# Patient Record
Sex: Male | Born: 1990
Health system: Southern US, Community
[De-identification: ages and names within clinical notes are randomized; demographics above are authoritative.]

## PROBLEM LIST (undated history)

## (undated) DIAGNOSIS — E7800 Pure hypercholesterolemia, unspecified: Secondary | ICD-10-CM

## (undated) DIAGNOSIS — F419 Anxiety disorder, unspecified: Secondary | ICD-10-CM

## (undated) DIAGNOSIS — F32A Depression, unspecified: Secondary | ICD-10-CM

## (undated) DIAGNOSIS — F329 Major depressive disorder, single episode, unspecified: Secondary | ICD-10-CM

## (undated) DIAGNOSIS — I10 Essential (primary) hypertension: Secondary | ICD-10-CM

## (undated) DIAGNOSIS — R569 Unspecified convulsions: Secondary | ICD-10-CM

## (undated) HISTORY — DX: Unspecified convulsions: R56.9

## (undated) HISTORY — PX: WISDOM TOOTH EXTRACTION: SHX21

## (undated) HISTORY — PX: VASECTOMY: SHX75

## (undated) HISTORY — DX: Major depressive disorder, single episode, unspecified: F32.9

## (undated) HISTORY — DX: Depression, unspecified: F32.A

## (undated) HISTORY — DX: Pure hypercholesterolemia, unspecified: E78.00

## (undated) HISTORY — DX: Anxiety disorder, unspecified: F41.9

---

## 2016-12-09 ENCOUNTER — Emergency Department (HOSPITAL_COMMUNITY)
Admission: EM | Admit: 2016-12-09 | Discharge: 2016-12-09 | Disposition: A | Discharge status: Home / Self Care | Payer: 59 | Attending: Emergency Medicine | Admitting: Emergency Medicine

## 2016-12-09 ENCOUNTER — Encounter (HOSPITAL_COMMUNITY): Payer: Self-pay | Admitting: Emergency Medicine

## 2016-12-09 DIAGNOSIS — R251 Tremor, unspecified: Secondary | ICD-10-CM | POA: Diagnosis not present

## 2016-12-09 DIAGNOSIS — I10 Essential (primary) hypertension: Secondary | ICD-10-CM | POA: Insufficient documentation

## 2016-12-09 DIAGNOSIS — R569 Unspecified convulsions: Secondary | ICD-10-CM | POA: Diagnosis present

## 2016-12-09 HISTORY — DX: Essential (primary) hypertension: I10

## 2016-12-09 LAB — BASIC METABOLIC PANEL
ANION GAP: 8 (ref 5–15)
BUN: 10 mg/dL (ref 6–20)
CHLORIDE: 102 mmol/L (ref 101–111)
CO2: 29 mmol/L (ref 22–32)
Calcium: 10 mg/dL (ref 8.9–10.3)
Creatinine, Ser: 0.8 mg/dL (ref 0.61–1.24)
GFR calc Af Amer: >60 mL/min (ref >60)
Glucose, Bld: 132 mg/dL — ABNORMAL HIGH (ref 65–99)
POTASSIUM: 4.2 mmol/L (ref 3.5–5.1)
SODIUM: 139 mmol/L (ref 135–145)

## 2016-12-09 NOTE — ED Triage Notes (Signed)
Pt arrives ambulatory to ed with c/o of possible seizure. Per pt wife states he had shaking activity last night while he was asleep. Pt is alert and ox4 at this time.

## 2016-12-09 NOTE — ED Provider Notes (Signed)
[facility]-EMERGENCY DEPT Provider Note   CSN: [id number] Arrival date & time: 12/09/16  1143     History   Chief Complaint Chief Complaint  Patient presents with  . possible seizure    HPI Ryan Gay is a 26 y.o. male.  Patient with history of hypertension presents with questionable seizure activity. No history of prior seizures. Patient states that his wife indicated that for approximately 30 seconds in the middle the night, approximate 3-4AM, he had a shaking spell for about 30 seconds. She was able to wake him up for a brief time but then he went back to sleep. No urinary incontinence. He did noted a small painful area on the tip of his tongue this morning that he attributed to a cold sore. States that he "feels hung over" today, but denies any recent alcohol use. No history of benzo use. No other illicit drugs. No headache or recent head injuries. No vision change, extremity weakness, facial droop, slurred speech. No nausea, vomiting. The onset of this condition was acute. The course is resolved. Aggravating factors: none. Alleviating factors: none.        Past Medical History:  Diagnosis Date  . Hypertension     There are no active problems to display for this patient.   No past surgical history on file.     Home Medications    Prior to Admission medications   Not on File    Family History No family history on file.  Social History Social History  Substance Use Topics  . Smoking status: Not on file  . Smokeless tobacco: Not on file  . Alcohol use Not on file     Allergies   Patient has no known allergies.   Review of Systems Review of Systems  Constitutional: Negative for fever.  HENT: Negative for rhinorrhea and sore throat.   Eyes: Negative for redness.  Respiratory: Negative for cough.   Cardiovascular: Negative for chest pain.  Gastrointestinal: Negative for abdominal pain, diarrhea, nausea and vomiting.  Genitourinary: Negative for dysuria.    Musculoskeletal: Negative for myalgias.  Skin: Negative for rash.  Neurological: Positive for seizures (Questionable). Negative for weakness and headaches.     Physical Exam Updated Vital Signs BP (!) 185/115 (BP Location: Right Arm)   Pulse 72   Temp 98.4 F (36.9 C) (Oral)   Resp 16   Ht 6\' 1"  (1.854 m)   Wt 113.4 kg (250 lb)   SpO2 98%   BMI 32.98 kg/m   Physical Exam  Constitutional: He is oriented to person, place, and time. He appears well-developed and well-nourished.  HENT:  Head: Normocephalic and atraumatic.  Right Ear: Tympanic membrane, external ear and ear canal normal.  Left Ear: Tympanic membrane, external ear and ear canal normal.  Nose: Nose normal.  Mouth/Throat: Uvula is midline, oropharynx is clear and moist and mucous membranes are normal.  There is a small lesion on the lateral tip of left tongue. Unclear etiology. No extensive trauma.   Eyes: Pupils are equal, round, and reactive to light. Conjunctivae, EOM and lids are normal. Right eye exhibits no discharge. Left eye exhibits no discharge.  Neck: Normal range of motion. Neck supple.  Cardiovascular: Normal rate, regular rhythm and normal heart sounds.   Pulmonary/Chest: Effort normal and breath sounds normal.  Abdominal: Soft. There is no tenderness.  Musculoskeletal: Normal range of motion.       Cervical back: He exhibits normal range of motion, no tenderness and no bony tenderness.  Neurological: He is alert and oriented to person, place, and time. He has normal strength and normal reflexes. No cranial nerve deficit or sensory deficit. He exhibits normal muscle tone. He displays a negative Romberg sign. Coordination and gait normal. GCS eye subscore is 4. GCS verbal subscore is 5. GCS motor subscore is 6.  Skin: Skin is warm and dry.  Psychiatric: He has a normal mood and affect.  Nursing note and vitals reviewed.    ED Treatments / Results  Labs (all labs ordered are listed, but only abnormal  results are displayed) Labs Reviewed  BASIC METABOLIC PANEL - Abnormal; Notable for the following:       Result Value   Glucose, Bld 132 (*)    All other components within normal limits     Procedures Procedures (including critical care time)  Medications Ordered in ED Medications - No data to display   Initial Impression / Assessment and Plan / ED Course  I have reviewed the triage vital signs and the nursing notes.  Pertinent labs & imaging results that were available during my care of the patient were reviewed by me and considered in my medical decision making (see chart for details).     Patient seen and examined. Work-up initiated.   Vital signs reviewed and are as follows: BP (!) 185/115 (BP Location: Right Arm)   Pulse 72   Temp 98.4 F (36.9 C) (Oral)   Resp 16   Ht 6\' 1"  (1.854 m)   Wt 113.4 kg (250 lb)   SpO2 98%   BMI 32.98 kg/m   Patient updated on results. Ambulatory referral given for neurology. Patient given note for work until cleared.  Final Clinical Impressions(s) / ED Diagnoses   Final diagnoses:  Episode of shaking   Patients with a shaking episode, unclear if this represented a seizure. Questionable lateral tongue biting. No incontinence. 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 Referral made.  New Prescriptions New Prescriptions   No medications on file     Renne CriglerGeiple, Janalynn Eder, Cordelia Poche-C 12/09/16 1508    Cathren LaineSteinl, Kevin, MD 12/10/16 [phone]

## 2016-12-09 NOTE — Discharge Instructions (Signed)
Please read and follow all provided instructions.  Your diagnoses today include:  1. Episode of shaking     Tests performed today include:  Vital signs. See below for your results today.   Electrolytes - slightly high blood sugar  Medications prescribed:   None  Home care instructions:  Follow any educational materials contained in this packet.  Follow-up instructions: Please follow-up with your primary care provider as needed for further evaluation of your symptoms.  Return instructions:   Please return to the Emergency Department if you experience worsening symptoms.   Please return if you have any other emergent concerns.  Additional Information:  Your vital signs today were: BP (!) 185/115 (BP Location: Right Arm)    Pulse 72    Temp 98.4 F (36.9 C) (Oral)    Resp 16    Ht 6\' 1"  (1.854 m)    Wt 113.4 kg (250 lb)    SpO2 98%    BMI 32.98 kg/m  If your blood pressure (BP) was elevated above 135/85 this visit, please have this repeated by your doctor within one month. ---------------

## 2016-12-13 DIAGNOSIS — S81811A Laceration without foreign body, right lower leg, initial encounter: Secondary | ICD-10-CM | POA: Diagnosis not present

## 2016-12-14 ENCOUNTER — Encounter: Payer: Self-pay | Admitting: Neurology

## 2016-12-14 ENCOUNTER — Ambulatory Visit (INDEPENDENT_AMBULATORY_CARE_PROVIDER_SITE_OTHER): Payer: 59 | Admitting: Neurology

## 2016-12-14 VITALS — BP 164/109 | HR 82 | Ht 73.0 in | Wt 268.6 lb

## 2016-12-14 DIAGNOSIS — R569 Unspecified convulsions: Secondary | ICD-10-CM | POA: Diagnosis not present

## 2016-12-14 DIAGNOSIS — I10 Essential (primary) hypertension: Secondary | ICD-10-CM | POA: Diagnosis not present

## 2016-12-14 DIAGNOSIS — E785 Hyperlipidemia, unspecified: Secondary | ICD-10-CM | POA: Diagnosis not present

## 2016-12-14 NOTE — Progress Notes (Signed)
NEUROLOGY CLINIC NEW PATIENT NOTE  NAME: Ryan Gay DOB: 02/10/1991 REFERRING PHYSICIAN: Renne CriglerGeiple, Joshua, PA-C  I saw Ryan Gay as a new consult in the neurovascular clinic today regarding  Chief Complaint  Patient presents with  . Rm 2  . New Patient (Initial Visit)  . Shaking    Pt for follow-up of 12/09/16 ED visit for shaking episode here to r/o possible seizure.  Ryan Gay.  HPI: Ryan MajorMichael Mandeville is a 26 y.o. male with PMH of HTN, HLD, anxiety and depression who presents as a new patient for questionable seizure-like activity at night.   Patient stated that on 12/09/16 early morning, wife witnessed patient having 1 episode of arm shaking with flexion during sleep, lasting 30 seconds. As per patient, wife said after the shaking, patient was aroused with brief eye opening, however back to sleep right away. Patient cannot recall, but only felt groggy in the morning. Wife claimed he had a tongue biting, however patient stated there was no blood in his mouth and he had chronic tongue ulcers. No b/b incontinence. This episode was never happened before and patient had no seizure history. [facility] follow-up, and recommend no driving until seen by neurologist.  Patient stated that he probably has OSA which likely makes him not sleeping well at night. He also had sleepwalking and sleep talking. Sometime he woke up in the morning in the couch, but he cannot recall why wall pain he went to couch from his bed. He also had a one episode of brief shaking jerking during sleep in 2008 but that was correlated to his vivid dreams of being choked.  He has history of hypertension on chlorthalidone, HLD on Lipitor 10 mg daily. BP today 164/109. Denies smoking, alcohol or illicit drugs.  Past Medical History:  Diagnosis Date  . Anxiety   . Depression   . High cholesterol   . Hypertension    Past Surgical History:  Procedure Laterality Date  . WISDOM TOOTH EXTRACTION     Family  History  Problem Relation Age of Onset  . Hypertension Mother   . Hypothyroidism Mother   . Hypertension Father   . Heart disease Father   . Liver cancer Maternal Grandfather    Current Outpatient Prescriptions  Medication Sig Dispense Refill  . atorvastatin (LIPITOR) 10 MG tablet TK 1 T PO D  2  . CARTIA XT 180 MG 24 hr capsule TK 2 CS PO D  4  . chlorthalidone (HYGROTON) 25 MG tablet TK 1 T PO QD  4   No current facility-administered medications for this visit.    No Known Allergies Social History   Social History  . Marital status: Married    Spouse name: N/A  . Number of children: 2  . Years of education: N/A   Occupational History  [place] [employer]    Social History Main Topics  . Smoking status: Former Smoker    Quit date: 10/03/2016  . Smokeless tobacco: Never Used  . Alcohol use Yes     Comment: 4 drinks per wk  . Drug use: No  . Sexual activity: Yes    Partners: Female   Other Topics Concern  . Not on file   Social History Narrative   Lives at home w/ wife and kids   Right-handed   Caffeine: 1 Red Bull daily    Review of Systems Full 14 system review of systems performed and notable only for those listed, all  others are neg:  Constitutional:   Cardiovascular:  Ear/Nose/Throat:   Skin:  Eyes:   Respiratory:   Gastroitestinal:   Genitourinary:  Hematology/Lymphatic:   Endocrine:  Musculoskeletal:   Allergy/Immunology:   Neurological:   Psychiatric: Depression, anxiety, too much sleep, decreased energy, disinterest in activities  Sleep:    Physical Exam  Vitals:   12/14/16 1142  BP: (!) 164/109  Pulse: 82    General - Well nourished, well developed, in no apparent distress.  Ophthalmologic - Sharp disc margins OU.  Cardiovascular - Regular rate and rhythm with no murmur. Carotid pulses were 2+ without bruits .   Neck - supple, no nuchal rigidity .  Mental Status -  Level of arousal and orientation to time, place, and person  were intact. Language including expression, naming, repetition, comprehension, reading, and writing was assessed and found intact. Attention span and concentration were normal. Fund of Knowledge was assessed and was intact.  Cranial Nerves II - XII - II - Visual field intact OU. III, IV, VI - Extraocular movements intact. V - Facial sensation intact bilaterally. VII - Facial movement intact bilaterally. VIII - Hearing & vestibular intact bilaterally. X - Palate elevates symmetrically. XI - Chin turning & shoulder shrug intact bilaterally. XII - Tongue protrusion intact.  Motor Strength - The patient's strength was normal in all extremities and pronator drift was absent.  Bulk was normal and fasciculations were absent.   Motor Tone - Muscle tone was assessed at the neck and appendages and was normal.  Reflexes - The patient's reflexes were normal in all extremities and he had no pathological reflexes.  Sensory - Light touch, temperature/pinprick, vibration and proprioception, and Romberg testing were assessed and were normal.    Coordination - The patient had normal movements in the hands and feet with no ataxia or dysmetria.  Tremor was absent.  Gait and Station - The patient's transfers, posture, gait, station, and turns were observed as normal.   Imaging none  Lab Review none  Assessment and Plan:   In summary, Ryan Gay is a 26 y.o. male with PMH of HTN and HLD presents As new patient for one episode of questionable nocturnal seizure activity. Differentials including sleep apnea induced hypoxia, new onset nocturnal seizure, seizure due to lowered seizure threshold secondary to OSA, REM sleep behavior disorder, or sleep walking. Will do MRI with and without contrast, EEG, and sleep study. No AEDs for now. Consider ambulatory long-term EEG if episodes recurrent and frequent. Seizure precautions, no driving for 6 months until spells free.   - will do MRI with and without  contrast - will do EEG - will do sleep study - no need for AEDs at this time  - if spells recurrent and frequent, will consider home EEG monitoring. - for safety concerns, according to [place] law, no driving until seizure free for 6 months and under physician's care.  - Please maintain seizure precautions.  - follow up in 2 months   Thank you very much for the opportunity to participate in the care of this patient.  Please do not hesitate to call if any questions or concerns arise.  Orders Placed This Encounter  Procedures  . MR BRAIN W WO CONTRAST    Standing Status:   Future    Standing Expiration Date:   02/13/2018    Order Specific Question:   If indicated for the ordered procedure, I authorize the administration of contrast media per Radiology protocol    Answer:  Yes    Order Specific Question:   What is the patient's sedation requirement?    Answer:   No Sedation    Order Specific Question:   Does the patient have a pacemaker or implanted devices?    Answer:   No    Order Specific Question:   Radiology Contrast Protocol - do NOT remove file path    Answer:   [email or web address]    Order Specific Question:   Reason for Exam additional comments    Answer:   seizure like activity    Order Specific Question:   Preferred imaging location?    Answer:   Internal  . Ambulatory referral to Sleep Studies    Referral Priority:   Routine    Referral Type:   Consultation    Referral Reason:   Specialty Services Required    Number of Visits Requested:   1  . EEG adult    Standing Status:   Future    Standing Expiration Date:   12/14/2017    Order Specific Question:   Where should this test be performed?    Answer:   [facility]    Meds ordered this encounter  Medications  . CARTIA XT 180 MG 24 hr capsule    Sig: TK 2 CS PO D    Refill:  4  . atorvastatin (LIPITOR) 10 MG tablet    Sig: TK 1 T PO D    Refill:  2  . chlorthalidone (HYGROTON) 25 MG tablet    Sig: TK 1  T PO QD    Refill:  4    Patient Instructions  - your spells the other day suspicious for seizure like activity, but differentials also include sleep apnea, REM sleep behaviors, sleep walking, etc. We also know sleep apnea could lower the seizure threshold.  - will do seizure work up including MRI with and without contrast - will do EEG - will do sleep study - no need for medications at this time  - if spells recurrent, please let us know and we may need home EEG monitoring. - for safety concerns, according to [place] law, you can not drive until seizure free for 6 months and under physician's care. However, you are able to back to work with other job duties without driving or activities where a loss of awareness could hurt you or someone else. - Please maintain seizure precautions. Do not participate in activities where a loss of awareness could hurt you or someone else.  No swimming alone, no tub bathing, no hot tubs, no driving, no operating motorized vehicles(cars, ATVs, motorbikes, etc), lawnmowers or power tools.  No standing at heights, such as rooftops, ladders or stairs. No sliding boards, monkey bars or swings, or climbing trees.  Avoid hot objects such as stoves, heaters, open fires.  Wear a helmet when riding a bicycle, scooter, skateboard, etc. and avoid areas of traffic.  Set water heater to 120 degrees or less. - follow up in 2 months     [name] Plan, MD PhD [facility] [address], [address] [place], [place] [address] [phone]

## 2016-12-14 NOTE — Patient Instructions (Addendum)
-   your spells the other day suspicious for seizure like activity, but differentials also include sleep apnea, REM sleep behaviors, sleep walking, etc. We also know sleep apnea could lower the seizure threshold.  - will do seizure work up including MRI with and without contrast - will do EEG - will do sleep study - no need for medications at this time  - if spells recurrent, please let us know and we may need home EEG monitoring. - for safety concerns, according to [place] law, you can not drive until seizure free for 6 months and under physician's care. However, you are able to back to work with other job duties without driving or activities where a loss of awareness could hurt you or someone else. - Please maintain seizure precautions. Do not participate in activities where a loss of awareness could hurt you or someone else.  No swimming alone, no tub bathing, no hot tubs, no driving, no operating motorized vehicles(cars, ATVs, motorbikes, etc), lawnmowers or power tools.  No standing at heights, such as rooftops, ladders or stairs. No sliding boards, monkey bars or swings, or climbing trees.  Avoid hot objects such as stoves, heaters, open fires.  Wear a helmet when riding a bicycle, scooter, skateboard, etc. and avoid areas of traffic.  Set water heater to 120 degrees or less. - follow up in 2 months

## 2016-12-15 DIAGNOSIS — E785 Hyperlipidemia, unspecified: Secondary | ICD-10-CM | POA: Insufficient documentation

## 2016-12-15 DIAGNOSIS — I10 Essential (primary) hypertension: Secondary | ICD-10-CM | POA: Insufficient documentation

## 2016-12-16 ENCOUNTER — Other Ambulatory Visit: Payer: 59

## 2016-12-30 ENCOUNTER — Encounter: Payer: Self-pay | Admitting: Neurology

## 2016-12-30 ENCOUNTER — Ambulatory Visit (INDEPENDENT_AMBULATORY_CARE_PROVIDER_SITE_OTHER): Payer: 59 | Admitting: Neurology

## 2016-12-30 VITALS — BP 157/105 | HR 116 | Wt 263.2 lb

## 2016-12-30 DIAGNOSIS — I10 Essential (primary) hypertension: Secondary | ICD-10-CM | POA: Diagnosis not present

## 2016-12-30 DIAGNOSIS — R569 Unspecified convulsions: Secondary | ICD-10-CM

## 2016-12-30 DIAGNOSIS — E785 Hyperlipidemia, unspecified: Secondary | ICD-10-CM | POA: Diagnosis not present

## 2016-12-30 NOTE — Patient Instructions (Signed)
-   encourage to have MRI and EEG done if able - will do sleep study - no need for AEDs at this time  - if spells recurrent and frequent, will consider home EEG monitoring. - refer to Dr. Karel Jarvis to get second opinion - for safety concerns, according to [place] law, no driving until seizure free for 6 months and under physician's care.  - Please maintain seizure precautions.  - follow up will be made after seeing Dr. Karel Jarvis

## 2016-12-30 NOTE — Progress Notes (Signed)
NEUROLOGY CLINIC NEW PATIENT NOTE  NAME: Ryan Gay DOB: [birth year] REFERRING PHYSICIAN: Boneta Lucks, NP  I saw Ryan Gay as a new consult in the neurovascular clinic today regarding  Chief Complaint  Patient presents with  . Follow-up    Pt wants to discuss driving he has been put on driving restictions,Pt never got EEG wanted a second p but never got it  .  HPI: Ryan Gay is a 26 y.o. male with PMH of HTN, HLD, anxiety and depression who presents as a new patient for questionable seizure-like activity at night.   Patient stated that on 12/09/16 early morning, wife witnessed patient having 1 episode of arm shaking with flexion during sleep, lasting 30 seconds. As per patient, wife said after the shaking, patient was aroused with brief eye opening, however back to sleep right away. Patient cannot recall, but only felt groggy in the morning. Wife claimed he had a tongue biting, however patient stated there was no blood in his mouth and he had chronic tongue ulcers. No b/b incontinence. This episode was never happened before and patient had no seizure history. Went to [facility] and recommend outpatient follow-up, and recommend no driving until seen by neurologist.  Patient stated that he probably has OSA which likely makes him not sleeping well at night. He also had sleepwalking and sleep talking. Sometime he woke up in the morning in the couch, but he cannot recall why wall pain he went to couch from his bed. He also had a one episode of brief shaking jerking during sleep in 2008 but that was correlated to his vivid dreams of being choked.  He has history of hypertension on chlorthalidone, HLD on Lipitor 10 mg daily. BP today 164/109. Denies smoking, alcohol or illicit drugs.  Interval history: During the interval time, pt has been doing ok. No recurrent symptoms. 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 I totally understand.  denies SI or HI. 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 However, I am not feel confident to do that. I told him about the process dealing with unexplained episode of potential LOC which prohibited him driving at this time. He has not had any test done yet, and scheduled for sleep study next week. He has some financial difficulty with out of pocket payment for MRI and EEG. He and his wife also asking for second opinion and I feel it is very reasonable.    Past Medical History:  Diagnosis Date  . Anxiety   . Depression   . High cholesterol   . Hypertension    Past Surgical History:  Procedure Laterality Date  . WISDOM TOOTH EXTRACTION     Family History  Problem Relation Age of Onset  . Hypertension Mother   . Hypothyroidism Mother   . Hypertension Father   . Heart disease Father   . Liver cancer Maternal Grandfather    Current Outpatient Prescriptions  Medication Sig Dispense Refill  . atorvastatin (LIPITOR) 10 MG tablet TK 1 T PO D  2  . CARTIA XT 180 MG 24 hr capsule TK 2 CS PO D  4  . chlorthalidone (HYGROTON) 25 MG tablet TK 1 T PO QD  4   No current facility-administered medications for this visit.    No Known Allergies Social History  Social History  . Marital status: Married    Spouse name: N/A  . Number of children: 2  . Years of education: N/A   Occupational History  . [employer]    Social History Main Topics  . Smoking status: Former Smoker    Quit date: 10/03/2016  . Smokeless tobacco: Never Used  . Alcohol use Yes     Comment: 4 drinks per wk  . Drug use: No  . Sexual activity: Yes    Partners: Female   Other Topics Concern  . Not on file   Social History Narrative   Lives at home w/ wife and kids   Right-handed   Caffeine: 1 Red Bull daily    Review of Systems Full 14 system review of systems performed and notable only for those listed, all  others are neg:  Constitutional:  fatigue Cardiovascular:  Ear/Nose/Throat:   Skin:  Eyes:   Respiratory:   Gastroitestinal:   Genitourinary:  Hematology/Lymphatic:   Endocrine:  Musculoskeletal:  Aching muscle Allergy/Immunology:   Neurological:  HA Psychiatric: Depression Sleep: insomnia, apnea, frequent waking, daytime sleepiness, sleep talking, sleep walking, acting out dreams   Physical Exam  Vitals:   12/30/16 1340  BP: (!) 157/105  Pulse: (!) 116    General - Well nourished, well developed, in no apparent distress.  Ophthalmologic - Sharp disc margins OU.  Cardiovascular - Regular rate and rhythm with no murmur. Carotid pulses were 2+ without bruits .   Neck - supple, no nuchal rigidity .  Mental Status -  Level of arousal and orientation to time, place, and person were intact. Language including expression, naming, repetition, comprehension, reading, and writing was assessed and found intact. Attention span and concentration were normal. Fund of Knowledge was assessed and was intact.  Cranial Nerves II - XII - II - Visual field intact OU. III, IV, VI - Extraocular movements intact. V - Facial sensation intact bilaterally. VII - Facial movement intact bilaterally. VIII - Hearing & vestibular intact bilaterally. X - Palate elevates symmetrically. XI - Chin turning & shoulder shrug intact bilaterally. XII - Tongue protrusion intact.  Motor Strength - The patient's strength was normal in all extremities and pronator drift was absent.  Bulk was normal and fasciculations were absent.   Motor Tone - Muscle tone was assessed at the neck and appendages and was normal.  Reflexes - The patient's reflexes were normal in all extremities and he had no pathological reflexes.  Sensory - Light touch, temperature/pinprick, vibration and proprioception, and Romberg testing were assessed and were normal.    Coordination - The patient had normal movements in the hands and  feet with no ataxia or dysmetria.  Tremor was absent.  Gait and Station - The patient's transfers, posture, gait, station, and turns were observed as normal.   Imaging pending  Lab Review none  Assessment:   In summary, Ryan Gay is a 26 y.o. male with PMH of HTN and HLD presents As new patient for one episode of questionable nocturnal seizure activity. Differentials including sleep apnea induced hypoxia, new onset nocturnal seizure, seizure due to lowered seizure threshold secondary to OSA, REM sleep behavior disorder, or sleep walking. Will do MRI with and without contrast, EEG, and sleep study. No AEDs for now. Consider ambulatory long-term EEG if episodes recurrent and frequent. Seizure precautions, no driving for 6 months until spells free. Again discuss with pt about reasoning for the tests and possible ddx and not feel he is  safe to drive. But will refer him to see Dr. Karel Jarvis for second opinion.    Plan: - encourage to have MRI and EEG done if able - will do sleep study - no need for AEDs at this time  - if spells recurrent and frequent, will consider home EEG monitoring. - refer to Dr. Karel Jarvis to get second opinion - for safety concerns, according to [place] law, no driving until seizure free for 6 months and under physician's care.  - Please maintain seizure precautions.  - follow up will be made after seeing Dr. Karel Jarvis  I spent more than 25 minutes of face to face time with the patient. Greater than 50% of time was spent in counseling and coordination of care. We discussed ddx of his spells, routine process for work up, no driving for 6 months post spell, and second opinion from Dr. Karel Jarvis.    Orders Placed This Encounter  Procedures  . Ambulatory referral to Neurology    Referral Priority:   Routine    Referral Type:   Consultation    Referral Reason:   Second Opinion    Referred to Provider:   Van Clines, MD    Requested Specialty:   Neurology    Number of Visits  Requested:   1    No orders of the defined types were placed in this encounter.   Patient Instructions  - encourage to have MRI and EEG done if able - will do sleep study - no need for AEDs at this time  - if spells recurrent and frequent, will consider home EEG monitoring. - refer to Dr. Karel Jarvis to get second opinion - for safety concerns, according to [place] law, no driving until seizure free for 6 months and under physician's care.  - Please maintain seizure precautions.  - follow up will be made after seeing Dr. Artelia Laroche, MD PhD [facility] [address], [address] [place], [place] [address] [phone]

## 2017-01-04 ENCOUNTER — Telehealth: Payer: Self-pay

## 2017-01-04 NOTE — Telephone Encounter (Signed)
Which form? Disability form? I think either PCP or [place] can fill it out.   [name] Plan, MD PhD Stroke Neurology 01/04/2017 1:32 PM

## 2017-01-04 NOTE — Telephone Encounter (Signed)
Message sent to Dr. Roda Shutters if he wants pt PCP to filled form out. Pt will be seeing Dr. Karel Jarvis at [facility] for a second opinion for his episodes.

## 2017-01-06 NOTE — Telephone Encounter (Signed)
Form will be filled out until pt sees Dr. Lahoma RockerAqunio at [facility] for second opinion.

## 2017-01-13 NOTE — Telephone Encounter (Signed)
I called pt back and explained in my error I misunderstood RN's note, pt has been advised that Dr Roda ShuttersXu was not in office last week but that it will be done this week (according to RN Katrina).  Pt also made aware of the $50.00 fee for the completion of the form, he okayed and will wait to be contacted re: the completion of the form

## 2017-01-13 NOTE — Telephone Encounter (Signed)
Pt has called for an update on the status of the paperwork being available to pick up.  Pt advised of last response from RN Katrina on 8-29, he is asking for a call back as to why it can not be done before, please call.

## 2017-01-13 NOTE — Telephone Encounter (Signed)
Disability form done for patient by Dr. Roda ShuttersXu. Form sent to medical records for processing and payment of 50.00 dollars.

## 2017-01-14 ENCOUNTER — Telehealth: Payer: Self-pay | Admitting: Neurology

## 2017-01-14 ENCOUNTER — Institutional Professional Consult (permissible substitution): Payer: 59 | Admitting: Neurology

## 2017-01-14 NOTE — Telephone Encounter (Signed)
Pt was a no show to their apt today scheduled at 1:00 pm

## 2017-01-15 ENCOUNTER — Encounter: Payer: Self-pay | Admitting: Neurology

## 2017-01-18 DIAGNOSIS — Z0289 Encounter for other administrative examinations: Secondary | ICD-10-CM

## 2017-02-22 ENCOUNTER — Ambulatory Visit: Payer: 59 | Admitting: Neurology

## 2017-03-09 ENCOUNTER — Ambulatory Visit: Payer: 59 | Admitting: Neurology

## 2017-03-09 DIAGNOSIS — I10 Essential (primary) hypertension: Secondary | ICD-10-CM | POA: Diagnosis not present

## 2017-04-30 ENCOUNTER — Telehealth: Payer: Self-pay | Admitting: Neurology

## 2017-04-30 NOTE — Telephone Encounter (Signed)
Rn spoke with patient wife about needing a letter for return to work in February. Rn stated pt requested a second opinion,and was referred to Dr. Karel JarvisAquino at [facility]. Rn stated pt cancel the appt. The wife stated they cancel the appt because of financial reasons. Rn stated pt will need to schedule an appt before he is release for work. PT schedule 06/02/2016 due to Dr. Roda ShuttersXu schedule being limited for February.

## 2017-04-30 NOTE — Telephone Encounter (Signed)
Pt's wife called he is wanting to return to work in February. Pt has not had any episodes. At available appt is 3/5. She did not want to schedule the appt, she said he just needs a release to return to work. Please call the pt at [phone]

## 2017-06-02 ENCOUNTER — Ambulatory Visit: Payer: Self-pay | Admitting: Neurology

## 2017-06-02 ENCOUNTER — Encounter: Payer: Self-pay | Admitting: Neurology

## 2017-06-02 ENCOUNTER — Telehealth: Payer: Self-pay | Admitting: Neurology

## 2017-06-02 VITALS — BP 146/95 | HR 84 | Wt 249.8 lb

## 2017-06-02 DIAGNOSIS — R569 Unspecified convulsions: Secondary | ICD-10-CM | POA: Diagnosis not present

## 2017-06-02 DIAGNOSIS — I10 Essential (primary) hypertension: Secondary | ICD-10-CM

## 2017-06-02 DIAGNOSIS — E785 Hyperlipidemia, unspecified: Secondary | ICD-10-CM

## 2017-06-02 NOTE — Progress Notes (Signed)
NEUROLOGY CLINIC NEW PATIENT NOTE  NAME: Ryan MajorMichael Gay DOB: 04/02/1991 REFERRING PHYSICIAN: No ref. provider found  I saw Ryan Gay as a new consult in the neurovascular clinic today regarding  Chief Complaint  Patient presents with  . 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  HPI: Ryan Gay is a 27 y.o. male with PMH of HTN, HLD, anxiety and depression who presents as a new patient for questionable seizure-like activity at night.   Patient stated that on 12/09/16 early morning, wife witnessed patient having 1 episode of arm shaking with flexion during sleep, lasting 30 seconds. As per patient, wife said after the shaking, patient was aroused with brief eye opening, however back to sleep right away. Patient cannot recall, but only felt groggy in the morning. Wife claimed he had a tongue biting, however patient stated there was no blood in his mouth and he had chronic tongue ulcers. No b/b incontinence. This episode was never happened before and patient had no seizure history. [facility] and recommend outpatient follow-up, and recommend no driving until seen by neurologist.  Patient stated that he probably has OSA which likely makes him not sleeping well at night. He also had sleepwalking and sleep talking. Sometime he woke up in the morning in the couch, but he cannot recall why wall pain he went to couch from his bed. He also had a one episode of brief shaking jerking during sleep in 2008 but that was correlated to his vivid dreams of being choked.  He has history of hypertension on chlorthalidone, HLD on Lipitor 10 mg daily. BP today 164/109. Denies smoking, alcohol or illicit drugs.  12/30/16 follow up - pt has been doing ok. No recurrent symptoms. 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 I totally understand. denies SI or HI. 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 However, I am not feel confident to do that. I told him about the process dealing with unexplained episode of potential LOC which prohibited him driving at this time. He has not had any test done yet, and scheduled for sleep study next week. He has some financial difficulty with out of pocket payment for MRI and EEG. He and his wife also asking for second opinion and I feel it is very reasonable.   Interval history: During the interval time, pt has been doing well. No recurrent seizure like activity. 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 However, he was not able to get MRI, EEG, sleep study or seeing Dr. Karel JarvisAquino due to insurance coverage and high deductible. He is near 6 months no driving and came in for re-evaluation.    Past Medical History:  Diagnosis Date  . Anxiety   . Depression   . High cholesterol   . Hypertension   . Seizures (HCC)    Past Surgical History:  Procedure Laterality Date  . WISDOM TOOTH EXTRACTION     Family History  Problem Relation Age of Onset  . Hypertension Mother   . Hypothyroidism Mother   . Hypertension Father   . Heart disease Father   .  Liver cancer Maternal Grandfather    Current Outpatient Medications  Medication Sig Dispense Refill  . atorvastatin (LIPITOR) 10 MG tablet TK 1 T PO D  2  . CARTIA XT 180 MG 24 hr capsule TK 2 CS PO D  4  . chlorthalidone (HYGROTON) 25 MG tablet TK 1 T PO QD  4   No current facility-administered medications for this visit.    No Known Allergies Social History   Socioeconomic History  . Marital status: Married    Spouse name: Not on file  . Number of children: 2  . Years of education: Not on file  . Highest education level: Not on file  Social Needs  . Financial resource strain: Not on file  . Food insecurity - worry: Not on file  . Food insecurity -  inability: Not on file  . Transportation needs - medical: Not on file  . Transportation needs - non-medical: Not on file  Occupational History  . Occupation: [employer]  Tobacco Use  . Smoking status: Former Smoker    Last attempt to quit: 10/03/2016    Years since quitting: 0.6  . Smokeless tobacco: Never Used  Substance and Sexual Activity  . Alcohol use: Yes    Comment: 4 drinks per wk  . Drug use: No  . Sexual activity: Yes    Partners: Female  Other Topics Concern  . Not on file  Social History Narrative   Lives at home w/ wife and kids   Right-handed   Caffeine: 1 Red Bull daily    Review of Systems Full 14 system review of systems performed and notable only for those listed, all others are neg:  Constitutional:  fatigue Cardiovascular:  Ear/Nose/Throat:   Skin:  Eyes:   Respiratory:   Gastroitestinal:   Genitourinary:  Hematology/Lymphatic:   Endocrine:  Musculoskeletal:   Allergy/Immunology:  Env allergy Neurological:   Psychiatric:  Sleep:    Physical Exam  Vitals:   06/02/17 1225  BP: (!) 146/95  Pulse: 84    General - Well nourished, well developed, in no apparent distress.  Ophthalmologic - Sharp disc margins OU.  Cardiovascular - Regular rate and rhythm with no murmur. Carotid pulses were 2+ without bruits .   Neck - supple, no nuchal rigidity .  Mental Status -  Level of arousal and orientation to time, place, and person were intact. Language including expression, naming, repetition, comprehension, reading, and writing was assessed and found intact. Attention span and concentration were normal. Fund of Knowledge was assessed and was intact.  Cranial Nerves II - XII - II - Visual field intact OU. III, IV, VI - Extraocular movements intact. V - Facial sensation intact bilaterally. VII - Facial movement intact bilaterally. VIII - Hearing & vestibular intact bilaterally. X - Palate elevates symmetrically. XI - Chin turning &  shoulder shrug intact bilaterally. XII - Tongue protrusion intact.  Motor Strength - The patient's strength was normal in all extremities and pronator drift was absent.  Bulk was normal and fasciculations were absent.   Motor Tone - Muscle tone was assessed at the neck and appendages and was normal.  Reflexes - The patient's reflexes were normal in all extremities and he had no pathological reflexes.  Sensory - Light touch, temperature/pinprick, vibration and proprioception, and Romberg testing were assessed and were normal.    Coordination - The patient had normal movements in the hands and feet with no ataxia or dysmetria.  Tremor was absent.  Gait  and Station - The patient's transfers, posture, gait, station, and turns were observed as normal.   Imaging pending  Lab Review none  Assessment:   In summary, Trindon Dorton is a 27 y.o. male with PMH of HTN and HLD presents As new patient for one episode of questionable nocturnal seizure activity. Differentials including sleep apnea induced hypoxia, new onset nocturnal seizure, seizure due to lowered seizure threshold secondary to OSA, REM sleep behavior disorder, or sleep walking. Will do MRI with and without contrast, EEG, and sleep study. No AEDs for now. Consider ambulatory long-term EEG if episodes recurrent and frequent. Seizure precautions, no driving for 6 months until spells free. Since then, he had no recurrent spells. However, he was not able to get MRI, EEG, sleep study or seeing Dr. Karel Jarvis for second opinion due to insurance coverage and high deductible. Has not been driving, but now close to 6 months.    Plan: - check with your insurance and we still encourage to have MRI and EEG as well as sleepy study done if able - in a week, you are OK to back to drive with caution. If you do not feel good that day, do not drive.  - no need for AEDs at this time  - if spells recurrent and frequent, will consider home EEG monitoring.  -  Please maintain seizure precautions.  - follow up in 6 months.   I spent more than 25 minutes of face to face time with the patient. Greater than 50% of time was spent in counseling and coordination of care.     No orders of the defined types were placed in this encounter.   No orders of the defined types were placed in this encounter.   Patient Instructions  - check with your insurance and we still encourage to have MRI and EEG as well as sleepy study done if able - in a week, you are OK to back to drive with caution. If you do not feel good that day, do not drive.  - no need for AEDs at this time  - if spells recurrent and frequent, will consider home EEG monitoring.  - Please maintain seizure precautions.  - follow up in 6 months.    [name] Plan, MD PhD [facility] [address] [place], [place] [address] [phone]

## 2017-06-02 NOTE — Telephone Encounter (Signed)
Sounds good. Thanks.   [name] [name], MD PhD Stroke Neurology 06/02/2017 5:33 PM

## 2017-06-02 NOTE — Telephone Encounter (Signed)
Error

## 2017-06-02 NOTE — Telephone Encounter (Deleted)
Open in error

## 2017-06-02 NOTE — Telephone Encounter (Signed)
Patient states he will callback to schedule 6 month follow-up.

## 2017-06-02 NOTE — Patient Instructions (Signed)
-   check with your insurance and we still encourage to have MRI and EEG as well as sleepy study done if able - in a week, you are OK to back to drive with caution. If you do not feel good that day, do not drive.  - no need for AEDs at this time  - if spells recurrent and frequent, will consider home EEG monitoring.  - Please maintain seizure precautions.  - follow up in 6 months.

## 2017-07-13 ENCOUNTER — Ambulatory Visit: Payer: Self-pay | Admitting: Neurology

## 2017-09-16 MED FILL — CHLORTHALIDONE 25 MG TAB: 25 | 90 days supply | Qty: 90 | Fill #0

## 2017-09-16 MED FILL — ATORVASTATIN 10 MG TABLET: 10 | 90 days supply | Qty: 90 | Fill #0

## 2017-09-16 MED FILL — DILTIAZEM 24HR ER 180 MG CA: 180 | 90 days supply | Qty: 180 | Fill #0

## 2017-12-27 MED FILL — ATORVASTATIN CALCIUM 20 MG: 20 | 90 days supply | Qty: 90 | Fill #0

## 2018-02-23 MED FILL — CARTIA XT 180 MG CAPSULE SA: 180 | 90 days supply | Qty: 180 | Fill #1

## 2018-04-26 MED FILL — CHLORTHALIDONE 25 MG TABS: 25 | 90 days supply | Qty: 90 | Fill #0

## 2018-10-19 MED FILL — CARTIA XT 180 MG CAPSULE SA: 180 | 90 days supply | Qty: 360 | Fill #0

## 2018-10-19 MED FILL — ATORVASTATIN 20 MG TABLET: 20 | 90 days supply | Qty: 90 | Fill #0

## 2018-10-19 MED FILL — CHLORTHALIDONE 25 MG TABLET: 25 | 90 days supply | Qty: 90 | Fill #0

## 2019-02-06 MED FILL — DIAZEPAM 10 MG TABS: 10 | 1 days supply | Qty: 1 | Fill #0

## 2019-02-11 MED FILL — ATORVASTATIN 20 MG TABLET: 20 | 90 days supply | Qty: 90 | Fill #1

## 2019-02-13 ENCOUNTER — Other Ambulatory Visit: Payer: Self-pay

## 2019-02-13 DIAGNOSIS — Z20822 Contact with and (suspected) exposure to covid-19: Secondary | ICD-10-CM

## 2019-02-15 LAB — NOVEL CORONAVIRUS, NAA: SARS-CoV-2, NAA: NOT DETECTED

## 2019-03-01 MED FILL — ATORVASTATIN 20 MG TABLET: 20 | 90 days supply | Qty: 90 | Fill #1

## 2019-03-01 MED FILL — DIAZEPAM 10 MG TABS: 10 | 1 days supply | Qty: 1 | Fill #0

## 2019-05-26 MED FILL — CHLORTHALIDONE 25 MG TABS: 25 | 90 days supply | Qty: 90 | Fill #1

## 2019-05-26 MED FILL — DILTIAZEM HCL ER COATED BEA: 180 | 90 days supply | Qty: 360 | Fill #1

## 2019-07-04 ENCOUNTER — Encounter (HOSPITAL_COMMUNITY): Payer: Self-pay | Admitting: Emergency Medicine

## 2019-07-04 ENCOUNTER — Other Ambulatory Visit: Payer: Self-pay

## 2019-07-04 ENCOUNTER — Inpatient Hospital Stay (HOSPITAL_COMMUNITY)
Admission: EM | Admit: 2019-07-04 | Discharge: 2019-07-06 | DRG: 305 | Disposition: A | Discharge status: Home / Self Care | Payer: No Typology Code available for payment source | Attending: Internal Medicine | Admitting: Internal Medicine

## 2019-07-04 ENCOUNTER — Emergency Department (HOSPITAL_COMMUNITY): Payer: No Typology Code available for payment source

## 2019-07-04 DIAGNOSIS — R739 Hyperglycemia, unspecified: Secondary | ICD-10-CM | POA: Diagnosis present

## 2019-07-04 DIAGNOSIS — R079 Chest pain, unspecified: Secondary | ICD-10-CM

## 2019-07-04 DIAGNOSIS — I160 Hypertensive urgency: Secondary | ICD-10-CM | POA: Diagnosis not present

## 2019-07-04 DIAGNOSIS — M109 Gout, unspecified: Secondary | ICD-10-CM

## 2019-07-04 DIAGNOSIS — I161 Hypertensive emergency: Secondary | ICD-10-CM | POA: Diagnosis not present

## 2019-07-04 DIAGNOSIS — M1A0720 Idiopathic chronic gout, left ankle and foot, without tophus (tophi): Secondary | ICD-10-CM | POA: Diagnosis present

## 2019-07-04 DIAGNOSIS — I10 Essential (primary) hypertension: Secondary | ICD-10-CM | POA: Diagnosis present

## 2019-07-04 DIAGNOSIS — K219 Gastro-esophageal reflux disease without esophagitis: Secondary | ICD-10-CM | POA: Diagnosis present

## 2019-07-04 DIAGNOSIS — Z20822 Contact with and (suspected) exposure to covid-19: Secondary | ICD-10-CM | POA: Diagnosis present

## 2019-07-04 DIAGNOSIS — E785 Hyperlipidemia, unspecified: Secondary | ICD-10-CM | POA: Diagnosis present

## 2019-07-04 DIAGNOSIS — Z79899 Other long term (current) drug therapy: Secondary | ICD-10-CM

## 2019-07-04 DIAGNOSIS — E876 Hypokalemia: Secondary | ICD-10-CM | POA: Diagnosis present

## 2019-07-04 DIAGNOSIS — E7800 Pure hypercholesterolemia, unspecified: Secondary | ICD-10-CM | POA: Diagnosis present

## 2019-07-04 DIAGNOSIS — Z87891 Personal history of nicotine dependence: Secondary | ICD-10-CM

## 2019-07-04 LAB — CBC
HCT: 45.6 % (ref 39.0–52.0)
Hemoglobin: 16.2 g/dL (ref 13.0–17.0)
MCH: 30.7 pg (ref 26.0–34.0)
MCHC: 35.5 g/dL (ref 30.0–36.0)
MCV: 86.5 fL (ref 80.0–100.0)
Platelets: 278 10*3/uL (ref 150–400)
RBC: 5.27 MIL/uL (ref 4.22–5.81)
RDW: 12.6 % (ref 11.5–15.5)
WBC: 9.1 10*3/uL (ref 4.0–10.5)
nRBC: 0 % (ref 0.0–0.2)

## 2019-07-04 LAB — TROPONIN I (HIGH SENSITIVITY)
Troponin I (High Sensitivity): 4 ng/L (ref <18)
Troponin I (High Sensitivity): 7 ng/L (ref <18)

## 2019-07-04 LAB — BASIC METABOLIC PANEL
Anion gap: 15 (ref 5–15)
BUN: 11 mg/dL (ref 6–20)
CO2: 22 mmol/L (ref 22–32)
Calcium: 9.2 mg/dL (ref 8.9–10.3)
Chloride: 99 mmol/L (ref 98–111)
Creatinine, Ser: 0.89 mg/dL (ref 0.61–1.24)
GFR calc Af Amer: >60 mL/min (ref >60)
GFR calc non Af Amer: >60 mL/min (ref >60)
Glucose, Bld: 177 mg/dL — ABNORMAL HIGH (ref 70–99)
Potassium: 3.4 mmol/L — ABNORMAL LOW (ref 3.5–5.1)
Sodium: 136 mmol/L (ref 135–145)

## 2019-07-04 MED ORDER — LABETALOL HCL 5 MG/ML IV SOLN: 20.0000 mg | Freq: Once | INTRAVENOUS | Status: DC

## 2019-07-04 MED ORDER — LORAZEPAM 2 MG/ML IJ SOLN
1.0000 mg | Freq: Once | INTRAMUSCULAR | Status: AC
  Administered 2019-07-04: 23:00:00 1 mg via INTRAVENOUS
  Filled 2019-07-04: qty 1

## 2019-07-04 MED ORDER — NITROGLYCERIN IN D5W 200-5 MCG/ML-% IV SOLN
0.0000 ug/min | INTRAVENOUS | Status: DC
  Administered 2019-07-04: 10 ug/min via INTRAVENOUS
  Filled 2019-07-04: qty 250

## 2019-07-04 MED ORDER — NICARDIPINE HCL IN NACL 20-0.86 MG/200ML-% IV SOLN
3.0000 mg/h | INTRAVENOUS | Status: DC
  Filled 2019-07-04: qty 200

## 2019-07-04 MED ORDER — CLONIDINE HCL 0.2 MG PO TABS
0.2000 mg | ORAL_TABLET | Freq: Once | ORAL | Status: AC
  Administered 2019-07-04: 20:00:00 0.2 mg via ORAL
  Filled 2019-07-04: qty 1

## 2019-07-04 MED ORDER — SODIUM CHLORIDE 0.9 % IV BOLUS: 1000.0000 mL | Freq: Once | INTRAVENOUS | Status: DC

## 2019-07-04 MED ORDER — LABETALOL HCL 5 MG/ML IV SOLN
10.0000 mg | Freq: Once | INTRAVENOUS | Status: AC
  Administered 2019-07-04: 22:00:00 10 mg via INTRAVENOUS
  Filled 2019-07-04: qty 4

## 2019-07-04 MED ORDER — IOHEXOL 350 MG/ML SOLN
100.0000 mL | Freq: Once | INTRAVENOUS | Status: AC | PRN
  Administered 2019-07-04: 21:00:00 100 mL via INTRAVENOUS

## 2019-07-04 MED ORDER — LORAZEPAM 2 MG/ML IJ SOLN: 1.0000 mg | Freq: Once | INTRAMUSCULAR | Status: DC

## 2019-07-04 MED ORDER — MORPHINE SULFATE (PF) 4 MG/ML IV SOLN
4.0000 mg | Freq: Once | INTRAVENOUS | Status: AC
  Administered 2019-07-04: 4 mg via INTRAVENOUS
  Filled 2019-07-04: qty 1

## 2019-07-04 NOTE — ED Notes (Signed)
Pt transported to CT ?

## 2019-07-04 NOTE — ED Notes (Signed)
Pt transported to xray 

## 2019-07-04 NOTE — ED Provider Notes (Signed)
Medical screening examination/treatment/procedure(s) were conducted as a shared visit with non-physician practitioner(s) and myself.  I personally evaluated the patient during the encounter.  EKG Interpretation  Date/Time:  Tuesday July 04 2019 18:49:30 EST Ventricular Rate:  97 PR Interval:    QRS Duration: 103 QT Interval:  327 QTC Calculation: 416 R Axis:   91 Text Interpretation: Sinus rhythm Inferior infarct, age indeterminate Anterolateral Q wave, probably normal for age Confirmed by Lorre Nick ([id number]) on 07/04/2019 8:30:85 PM 29 year old male with history of hypertension presents with substernal chest discomfort and associated high blood pressure.  Patient blood pressure difficult to control here.  He has been given multiple medications and remains hypertensive.  Will start on Cardene drip.  CT of the chest negative for dissection.  Will admit for observation  CRITICAL CARE Performed by: Toy Baker Total critical care time: 50 minutes Critical care time was exclusive of separately billable procedures and treating other patients. Critical care was necessary to treat or prevent imminent or life-threatening deterioration. Critical care was time spent personally by me on the following activities: development of treatment plan with patient and/or surrogate as well as nursing, discussions with consultants, evaluation of patient's response to treatment, examination of patient, obtaining history from patient or surrogate, ordering and performing treatments and interventions, ordering and review of laboratory studies, ordering and review of radiographic studies, pulse oximetry and re-evaluation of patient's condition.    Lorre Nick, MD 07/04/19 2314

## 2019-07-04 NOTE — ED Provider Notes (Signed)
[facility] EMERGENCY DEPARTMENT Provider Note   CSN: [id number] Arrival date & time: 07/04/19  1658     History Chief Complaint  Patient presents with  . Chest Pain    Ryan Gay is a 29 y.o. male with history of hypertension and HLD who presents with chest pain. He states symptoms started around 4:15PM today. It started about 30 min after eating a sandwich. He was sitting when it started. He felt like an "inpending sense of doom" like an anxiety attack and his throat was tight. He also started to feels some chest pain over the left side of the chest which radiated to the L shoulder and jaw. He reports associated clamminess and lightheadedness. He felt like he was going to pass out. He doesn't feel short of breath but feels like he is "not getting air". BP was noted to be significantly elevated with SBP in the 200s. EMS gave ASA and nitro which improved his symptoms. Pain is currently 2/10. He vapes and drinks ETOH. He denies drug use. He reports significant family hx of heart disease and HTN. His father was diagnosed with CAD in his 45s. He had similar pain 2 days ago while sitting and it resolved after he took an ASA. He is a former Dance movement psychotherapist. No fever, chills, syncope, cough, leg swelling. He is having some abdominal discomfort and "acid reflux" currently.  HPI     Past Medical History:  Diagnosis Date  . Anxiety   . Depression   . High cholesterol   . Hypertension   . Seizures [facility])     Patient Active Problem List   Diagnosis Date Noted  . Essential hypertension 12/15/2016  . Hyperlipidemia 12/15/2016  . Seizure-like activity (HCC) 12/14/2016    Past Surgical History:  Procedure Laterality Date  . WISDOM TOOTH EXTRACTION         Family History  Problem Relation Age of Onset  . Hypertension Mother   . Hypothyroidism Mother   . Hypertension Father   . Heart disease Father   . Liver cancer Maternal Grandfather     Social History   Tobacco  Use  . Smoking status: Former Smoker    Quit date: 10/03/2016    Years since quitting: 2.7  . Smokeless tobacco: Never Used  Substance Use Topics  . Alcohol use: Yes    Comment: 4 drinks per wk  . Drug use: No    Home Medications Prior to Admission medications   Medication Sig Start Date End Date Taking? Authorizing Provider  atorvastatin (LIPITOR) 10 MG tablet TK 1 T PO D 11/18/16   [provider]  CARTIA XT 180 MG 24 hr capsule TK 2 CS PO D 11/18/16   [provider]  chlorthalidone (HYGROTON) 25 MG tablet TK 1 T PO QD 11/18/16   [provider]    Allergies    Patient has no known allergies.  Review of Systems   Review of Systems  Constitutional: Positive for diaphoresis. Negative for fever.  Respiratory: Positive for shortness of breath. Negative for cough and wheezing.   Cardiovascular: Positive for chest pain. Negative for palpitations and leg swelling.  Gastrointestinal: Positive for abdominal pain. Negative for nausea and vomiting.  Neurological: Positive for light-headedness. Negative for syncope and weakness.  All other systems reviewed and are negative.   Physical Exam Updated Vital Signs BP (!) 172/116 (BP Location: Right Arm)   Pulse (!) 120   Temp 99.4 F (37.4 C) (Oral)  Resp 20   Ht 6\' 2"  (1.88 m)   Wt 136.1 kg   SpO2 97%   BMI 38.52 kg/m   Physical Exam Vitals and nursing note reviewed.  Constitutional:      General: He is not in acute distress.    Appearance: He is well-developed. He is obese. He is not ill-appearing.  HENT:     Head: Normocephalic and atraumatic.  Eyes:     General: No scleral icterus.       Right eye: No discharge.        Left eye: No discharge.     Conjunctiva/sclera: Conjunctivae normal.     Pupils: Pupils are equal, round, and reactive to light.  Cardiovascular:     Rate and Rhythm: Regular rhythm. Tachycardia present.  Pulmonary:     Effort: Pulmonary effort is normal. No respiratory  distress.     Breath sounds: Normal breath sounds.  Abdominal:     General: There is no distension.     Palpations: Abdomen is soft.     Tenderness: There is no abdominal tenderness.  Musculoskeletal:     Cervical back: Normal range of motion.     Right lower leg: No edema.     Left lower leg: No edema.  Skin:    General: Skin is warm and dry.  Neurological:     Mental Status: He is alert and oriented to person, place, and time.  Psychiatric:        Behavior: Behavior normal.     ED Results / Procedures / Treatments   Labs (all labs ordered are listed, but only abnormal results are displayed) Labs Reviewed  BASIC METABOLIC PANEL - Abnormal; Notable for the following components:      Result Value   Potassium 3.4 (*)    Glucose, Bld 177 (*)    All other components within normal limits  CBC  TROPONIN I (HIGH SENSITIVITY)  TROPONIN I (HIGH SENSITIVITY)    EKG EKG Interpretation  Date/Time:  Tuesday July 04 2019 18:49:30 EST Ventricular Rate:  97 PR Interval:  142 QRS Duration: 103 QT Interval:  327 QTC Calculation: 416 R Axis:   91 Text Interpretation: Sinus rhythm Inferior infarct, age indeterminate Anterolateral Q wave, probably normal for age Confirmed by Lacretia Leigh [phone]) on 07/04/2019 8:12:07 PM   Radiology DG Chest 2 View  Result Date: 07/04/2019 CLINICAL DATA:  Chest pain. Shortness of breath. Diaphoresis. The pain radiates into the left jaw. EXAM: CHEST - 2 VIEW COMPARISON:  None FINDINGS: The heart size and pulmonary vascularity are normal. Slight atelectasis at the right lung base, likely due to a shallow inspiration. The lungs are otherwise clear. No effusions. No bone abnormality. IMPRESSION: Minimal atelectasis at the right lung base. Electronically Signed   By: Lorriane Shire M.D.   On: 07/04/2019 19:48   CT Angio Chest PE W/Cm &/Or Wo Cm  Result Date: 07/04/2019 CLINICAL DATA:  29 year old male with shortness of breath. Concern for aortic  dissection or pulmonary embolism. EXAM: CT ANGIOGRAPHY CHEST, ABDOMEN AND PELVIS TECHNIQUE: Multidetector CT imaging through the chest, abdomen and pelvis was performed using the standard protocol during bolus administration of intravenous contrast. Multiplanar reconstructed images and MIPs were obtained and reviewed to evaluate the vascular anatomy. CONTRAST:  164mL OMNIPAQUE IOHEXOL 350 MG/ML SOLN COMPARISON:  Chest radiograph dated 07/04/2019. FINDINGS: CTA CHEST FINDINGS Cardiovascular: There is no cardiomegaly or pericardial effusion. The thoracic aorta is unremarkable. The origins of the great vessels of the aortic  arch appear patent. There is common origin of the left common carotid artery and right brachiocephalic trunk. No CT evidence of pulmonary embolism. Mediastinum/Nodes: No hilar or mediastinal adenopathy. The esophagus is grossly unremarkable. No mediastinal fluid collection. Lungs/Pleura: There is eventration of the right hemidiaphragm. Bilateral diffuse hazy densities most consistent with atelectasis. Infiltrate is less likely. Clinical correlation is recommended. No focal consolidation, pleural effusion, or pneumothorax. The central airways are patent. Musculoskeletal: No chest wall abnormality. No acute or significant osseous findings. Review of the MIP images confirms the above findings. CTA ABDOMEN AND PELVIS FINDINGS VASCULAR Aorta: Normal caliber aorta without aneurysm, dissection, vasculitis or significant stenosis. Celiac: Patent without evidence of aneurysm, dissection, vasculitis or significant stenosis. SMA: Patent without evidence of aneurysm, dissection, vasculitis or significant stenosis. Renals: Both renal arteries are patent without evidence of aneurysm, dissection, vasculitis, fibromuscular dysplasia or significant stenosis. IMA: Patent without evidence of aneurysm, dissection, vasculitis or significant stenosis. Inflow: Patent without evidence of aneurysm, dissection, vasculitis or  significant stenosis. Veins: The SMV, splenic vein, and main portal vein are patent. No portal venous gas. The IVC at is unremarkable for the degree of opacification. Review of the MIP images confirms the above findings. NON-VASCULAR No intra-abdominal free air or free fluid. Hepatobiliary: Diffuse fatty infiltration of the liver. No intrahepatic biliary ductal dilatation. The gallbladder is unremarkable. Pancreas: Unremarkable. No pancreatic ductal dilatation or surrounding inflammatory changes. Spleen: Normal in size without focal abnormality. Adrenals/Urinary Tract: Adrenal glands are unremarkable. Kidneys are normal, without renal calculi, focal lesion, or hydronephrosis. Bladder is unremarkable. Stomach/Bowel: Stomach is within normal limits. Appendix appears normal. No evidence of bowel wall thickening, distention, or inflammatory changes. Lymphatic: No adenopathy. Reproductive: The prostate and seminal vesicles are grossly unremarkable. Other: None Musculoskeletal: No acute or significant osseous findings. Review of the MIP images confirms the above findings. IMPRESSION: 1. No acute intrathoracic, abdominal, or pelvic pathology. No pulmonary embolism or aortic dissection. 2. Fatty liver. Electronically Signed   By: Elgie Collard M.D.   On: 07/04/2019 21:33   CT Angio Chest/Abd/Pel for Dissection W and/or W/WO  Result Date: 07/04/2019 CLINICAL DATA:  29 year old male with shortness of breath. Concern for aortic dissection or pulmonary embolism. EXAM: CT ANGIOGRAPHY CHEST, ABDOMEN AND PELVIS TECHNIQUE: Multidetector CT imaging through the chest, abdomen and pelvis was performed using the standard protocol during bolus administration of intravenous contrast. Multiplanar reconstructed images and MIPs were obtained and reviewed to evaluate the vascular anatomy. CONTRAST:  OMNIPAQUE IOHEXOL 350 MG/ML SOLN COMPARISON:  Chest radiograph dated 07/04/2019. FINDINGS: CTA CHEST FINDINGS Cardiovascular: There  is no cardiomegaly or pericardial effusion. The thoracic aorta is unremarkable. The origins of the great vessels of the aortic arch appear patent. There is common origin of the left common carotid artery and right brachiocephalic trunk. No CT evidence of pulmonary embolism. Mediastinum/Nodes: No hilar or mediastinal adenopathy. The esophagus is grossly unremarkable. No mediastinal fluid collection. Lungs/Pleura: There is eventration of the right hemidiaphragm. Bilateral diffuse hazy densities most consistent with atelectasis. Infiltrate is less likely. Clinical correlation is recommended. No focal consolidation, pleural effusion, or pneumothorax. The central airways are patent. Musculoskeletal: No chest wall abnormality. No acute or significant osseous findings. Review of the MIP images confirms the above findings. CTA ABDOMEN AND PELVIS FINDINGS VASCULAR Aorta: Normal caliber aorta without aneurysm, dissection, vasculitis or significant stenosis. Celiac: Patent without evidence of aneurysm, dissection, vasculitis or significant stenosis. SMA: Patent without evidence of aneurysm, dissection, vasculitis or significant stenosis. Renals: Both renal arteries are  patent without evidence of aneurysm, dissection, vasculitis, fibromuscular dysplasia or significant stenosis. IMA: Patent without evidence of aneurysm, dissection, vasculitis or significant stenosis. Inflow: Patent without evidence of aneurysm, dissection, vasculitis or significant stenosis. Veins: The SMV, splenic vein, and main portal vein are patent. No portal venous gas. The IVC at is unremarkable for the degree of opacification. Review of the MIP images confirms the above findings. NON-VASCULAR No intra-abdominal free air or free fluid. Hepatobiliary: Diffuse fatty infiltration of the liver. No intrahepatic biliary ductal dilatation. The gallbladder is unremarkable. Pancreas: Unremarkable. No pancreatic ductal dilatation or surrounding inflammatory changes.  Spleen: Normal in size without focal abnormality. Adrenals/Urinary Tract: Adrenal glands are unremarkable. Kidneys are normal, without renal calculi, focal lesion, or hydronephrosis. Bladder is unremarkable. Stomach/Bowel: Stomach is within normal limits. Appendix appears normal. No evidence of bowel wall thickening, distention, or inflammatory changes. Lymphatic: No adenopathy. Reproductive: The prostate and seminal vesicles are grossly unremarkable. Other: None Musculoskeletal: No acute or significant osseous findings. Review of the MIP images confirms the above findings. IMPRESSION: 1. No acute intrathoracic, abdominal, or pelvic pathology. No pulmonary embolism or aortic dissection. 2. Fatty liver. Electronically Signed   By: Elgie Collard M.D.   On: 07/04/2019 21:33    Procedures Procedures (including critical care time)  CRITICAL CARE Performed by: Bethel Born   Total critical care time: 40 minutes  Critical care time was exclusive of separately billable procedures and treating other patients.  Critical care was necessary to treat or prevent imminent or life-threatening deterioration.  Critical care was time spent personally by me on the following activities: development of treatment plan with patient and/or surrogate as well as nursing, discussions with consultants, evaluation of patient's response to treatment, examination of patient, obtaining history from patient or surrogate, ordering and performing treatments and interventions, ordering and review of laboratory studies, ordering and review of radiographic studies, pulse oximetry and re-evaluation of patient's condition.   Medications Ordered in ED Medications  nitroGLYCERIN 50 mg in dextrose 5 % 250 mL (0.2 mg/mL) infusion (0 mcg/min Intravenous Stopped 07/05/19 1846)  atorvastatin (LIPITOR) tablet 20 mg (20 mg Oral Given 07/05/19 1713)  chlorthalidone (HYGROTON) tablet 25 mg (25 mg Oral Given 07/06/19 0819)  acetaminophen  (TYLENOL) tablet 650 mg (has no administration in time range)    Or  acetaminophen (TYLENOL) suppository 650 mg (has no administration in time range)  ondansetron (ZOFRAN) tablet 4 mg (has no administration in time range)    Or  ondansetron (ZOFRAN) injection 4 mg (has no administration in time range)  enoxaparin (LOVENOX) injection 60 mg (60 mg Subcutaneous Given 07/05/19 1507)  aspirin EC tablet 81 mg (81 mg Oral Given 07/06/19 0824)  amLODipine (NORVASC) tablet 10 mg (10 mg Oral Given 07/06/19 0823)  carvedilol (COREG) tablet 6.25 mg (6.25 mg Oral Given 07/06/19 0823)  metoprolol tartrate (LOPRESSOR) injection 5 mg (5 mg Intravenous Given 07/06/19 0040)  losartan (COZAAR) tablet 25 mg (has no administration in time range)  colchicine tablet 0.6 mg (has no administration in time range)  cloNIDine (CATAPRES) tablet 0.2 mg (0.2 mg Oral Given 07/04/19 1950)  iohexol (OMNIPAQUE) 350 MG/ML injection 100 mL (100 mLs Intravenous Contrast Given 07/04/19 2109)  labetalol (NORMODYNE) injection 10 mg (10 mg Intravenous Given 07/04/19 2215)  LORazepam (ATIVAN) injection 1 mg (1 mg Intravenous Given 07/04/19 2242)  morphine 4 MG/ML injection 4 mg (4 mg Intravenous Given 07/04/19 2241)    ED Course  I have reviewed the triage vital signs and the  nursing notes.  Pertinent labs & imaging results that were available during my care of the patient were reviewed by me and considered in my medical decision making (see chart for details).  29 year old with acute onset of chest pain, sob, lightheaded/diaphoresis. He has known hx of longstanding uncontrolled BP since he was a teenager. BP is significantly elevated here with BP ~200/120 and he is having active chest pain that improves with nitro. EKG is SR with some T wave inversions in the inferior leads. Will obtain labs, CXR, trop. Will give dose of Clonidine.  CBC is normal. BMP shows hyperglycemia (177). Trop is 4 and 7 respectively. CXR is negative. Shared visit  with Dr. Freida Busman. He continues to be hypertensive and mildly tachycardic and is having pain. Will obtain CTA chest/abd/pelvis to r/o PE or dissection.  CT is negative. He was given labetolol for BP control and it did improve BP briefly and his pain improved but BP quickly rebounded to ~180/110. Will give morphine, ativan.   BP is still elevated. Pain is improving but persistent. Will admit for hypertensive urgency/emergency. Cardene drip ordered for BP control but I was informed this would lead to pt needing ICU care which I do not think he needs. Discussed with Dr. Toniann Fail with [facility] who recommends nitro drip. He will come to see pt.   MDM Rules/Calculators/A&P                     Final Clinical Impression(s) / ED Diagnoses Final diagnoses:  Hypertensive emergency  Nonspecific chest pain    Rx / DC Orders ED Discharge Orders    None       Bethel Born, PA-C 07/06/19 0959    Lorre Nick, MD 07/07/19 [phone]

## 2019-07-04 NOTE — ED Triage Notes (Signed)
Per [facility] pt c/o central/left sided chest pain onset about an hour ago. Patient given 1 nitro and 324 aspirin PTA. Pain went from 6/10 to 4/10. Describes pain as pressure and radiating into left jaw. Patient short of breath and diaphoretic on arrival. Reports taking medications as prescribed.

## 2019-07-05 ENCOUNTER — Encounter (HOSPITAL_COMMUNITY): Payer: Self-pay | Admitting: Internal Medicine

## 2019-07-05 DIAGNOSIS — M1A0720 Idiopathic chronic gout, left ankle and foot, without tophus (tophi): Secondary | ICD-10-CM | POA: Diagnosis present

## 2019-07-05 DIAGNOSIS — M109 Gout, unspecified: Secondary | ICD-10-CM | POA: Diagnosis not present

## 2019-07-05 DIAGNOSIS — Z20822 Contact with and (suspected) exposure to covid-19: Secondary | ICD-10-CM | POA: Diagnosis present

## 2019-07-05 DIAGNOSIS — I161 Hypertensive emergency: Secondary | ICD-10-CM | POA: Diagnosis present

## 2019-07-05 DIAGNOSIS — Z79899 Other long term (current) drug therapy: Secondary | ICD-10-CM | POA: Diagnosis not present

## 2019-07-05 DIAGNOSIS — I208 Other forms of angina pectoris: Secondary | ICD-10-CM

## 2019-07-05 DIAGNOSIS — K219 Gastro-esophageal reflux disease without esophagitis: Secondary | ICD-10-CM | POA: Diagnosis present

## 2019-07-05 DIAGNOSIS — R0789 Other chest pain: Secondary | ICD-10-CM | POA: Diagnosis not present

## 2019-07-05 DIAGNOSIS — E785 Hyperlipidemia, unspecified: Secondary | ICD-10-CM | POA: Diagnosis present

## 2019-07-05 DIAGNOSIS — I160 Hypertensive urgency: Secondary | ICD-10-CM

## 2019-07-05 DIAGNOSIS — R079 Chest pain, unspecified: Secondary | ICD-10-CM | POA: Diagnosis not present

## 2019-07-05 DIAGNOSIS — I10 Essential (primary) hypertension: Secondary | ICD-10-CM | POA: Diagnosis present

## 2019-07-05 DIAGNOSIS — Z87891 Personal history of nicotine dependence: Secondary | ICD-10-CM | POA: Diagnosis not present

## 2019-07-05 DIAGNOSIS — E782 Mixed hyperlipidemia: Secondary | ICD-10-CM | POA: Diagnosis not present

## 2019-07-05 DIAGNOSIS — E876 Hypokalemia: Secondary | ICD-10-CM | POA: Diagnosis present

## 2019-07-05 DIAGNOSIS — E7800 Pure hypercholesterolemia, unspecified: Secondary | ICD-10-CM | POA: Diagnosis present

## 2019-07-05 DIAGNOSIS — R739 Hyperglycemia, unspecified: Secondary | ICD-10-CM | POA: Diagnosis present

## 2019-07-05 LAB — CBC
HCT: 41.9 % (ref 39.0–52.0)
Hemoglobin: 14.6 g/dL (ref 13.0–17.0)
MCH: 30.4 pg (ref 26.0–34.0)
MCHC: 34.8 g/dL (ref 30.0–36.0)
MCV: 87.3 fL (ref 80.0–100.0)
Platelets: 220 10*3/uL (ref 150–400)
RBC: 4.8 MIL/uL (ref 4.22–5.81)
RDW: 12.9 % (ref 11.5–15.5)
WBC: 7.3 10*3/uL (ref 4.0–10.5)
nRBC: 0 % (ref 0.0–0.2)

## 2019-07-05 LAB — COMPREHENSIVE METABOLIC PANEL
ALT: 87 U/L — ABNORMAL HIGH (ref 0–44)
AST: 61 U/L — ABNORMAL HIGH (ref 15–41)
Albumin: 3.4 g/dL — ABNORMAL LOW (ref 3.5–5.0)
Alkaline Phosphatase: 83 U/L (ref 38–126)
Anion gap: 13 (ref 5–15)
BUN: 11 mg/dL (ref 6–20)
CO2: 24 mmol/L (ref 22–32)
Calcium: 9.1 mg/dL (ref 8.9–10.3)
Chloride: 102 mmol/L (ref 98–111)
Creatinine, Ser: 0.92 mg/dL (ref 0.61–1.24)
GFR calc Af Amer: >60 mL/min (ref >60)
GFR calc non Af Amer: >60 mL/min (ref >60)
Glucose, Bld: 117 mg/dL — ABNORMAL HIGH (ref 70–99)
Potassium: 3.6 mmol/L (ref 3.5–5.1)
Sodium: 139 mmol/L (ref 135–145)
Total Bilirubin: 1 mg/dL (ref 0.3–1.2)
Total Protein: 6.2 g/dL — ABNORMAL LOW (ref 6.5–8.1)

## 2019-07-05 LAB — CBC WITH DIFFERENTIAL/PLATELET
Abs Immature Granulocytes: 0.02 10*3/uL (ref 0.00–0.07)
Basophils Absolute: 0.1 10*3/uL (ref 0.0–0.1)
Basophils Relative: 1 %
Eosinophils Absolute: 0.5 10*3/uL (ref 0.0–0.5)
Eosinophils Relative: 6 %
HCT: 42.9 % (ref 39.0–52.0)
Hemoglobin: 14.9 g/dL (ref 13.0–17.0)
Immature Granulocytes: 0 %
Lymphocytes Relative: 39 %
Lymphs Abs: 2.9 10*3/uL (ref 0.7–4.0)
MCH: 30.4 pg (ref 26.0–34.0)
MCHC: 34.7 g/dL (ref 30.0–36.0)
MCV: 87.6 fL (ref 80.0–100.0)
Monocytes Absolute: 0.7 10*3/uL (ref 0.1–1.0)
Monocytes Relative: 10 %
Neutro Abs: 3.2 10*3/uL (ref 1.7–7.7)
Neutrophils Relative %: 44 %
Platelets: 215 10*3/uL (ref 150–400)
RBC: 4.9 MIL/uL (ref 4.22–5.81)
RDW: 12.9 % (ref 11.5–15.5)
WBC: 7.3 10*3/uL (ref 4.0–10.5)
nRBC: 0 % (ref 0.0–0.2)

## 2019-07-05 LAB — RAPID URINE DRUG SCREEN, HOSP PERFORMED
Amphetamines: NOT DETECTED
Barbiturates: NOT DETECTED
Benzodiazepines: NOT DETECTED
Cocaine: NOT DETECTED
Opiates: POSITIVE — AB
Tetrahydrocannabinol: NOT DETECTED

## 2019-07-05 LAB — CREATININE, SERUM
Creatinine, Ser: 0.91 mg/dL (ref 0.61–1.24)
GFR calc Af Amer: >60 mL/min (ref >60)
GFR calc non Af Amer: >60 mL/min (ref >60)

## 2019-07-05 LAB — LDL CHOLESTEROL, DIRECT: Direct LDL: 64.2 mg/dL (ref 0–99)

## 2019-07-05 LAB — LIPID PANEL
Cholesterol: 158 mg/dL (ref 0–200)
HDL: 26 mg/dL — ABNORMAL LOW (ref >40)
LDL Cholesterol: UNDETERMINED mg/dL (ref 0–99)
Total CHOL/HDL Ratio: 6.1 RATIO
Triglycerides: 498 mg/dL — ABNORMAL HIGH (ref <150)
VLDL: UNDETERMINED mg/dL (ref 0–40)

## 2019-07-05 LAB — SARS CORONAVIRUS 2 (TAT 6-24 HRS): SARS Coronavirus 2: NEGATIVE

## 2019-07-05 LAB — TROPONIN I (HIGH SENSITIVITY)
Troponin I (High Sensitivity): 6 ng/L (ref <18)
Troponin I (High Sensitivity): 5 ng/L (ref <18)

## 2019-07-05 LAB — HEMOGLOBIN A1C
Hgb A1c MFr Bld: 6.1 % — ABNORMAL HIGH (ref 4.8–5.6)
Mean Plasma Glucose: 128.37 mg/dL

## 2019-07-05 LAB — HIV ANTIBODY (ROUTINE TESTING W REFLEX): HIV Screen 4th Generation wRfx: NONREACTIVE

## 2019-07-05 MED ORDER — ONDANSETRON HCL 4 MG PO TABS: 4.0000 mg | ORAL_TABLET | Freq: Four times a day (QID) | ORAL | Status: DC | PRN

## 2019-07-05 MED ORDER — ACETAMINOPHEN 650 MG RE SUPP: 650.0000 mg | Freq: Four times a day (QID) | RECTAL | Status: DC | PRN

## 2019-07-05 MED ORDER — DILTIAZEM HCL ER COATED BEADS 360 MG PO CP24: 360.0000 mg | ORAL_CAPSULE | Freq: Two times a day (BID) | ORAL | Status: DC

## 2019-07-05 MED ORDER — CHLORTHALIDONE 25 MG PO TABS
25.0000 mg | ORAL_TABLET | Freq: Every day | ORAL | Status: DC
  Administered 2019-07-05 – 2019-07-06 (×2): 25 mg via ORAL
  Filled 2019-07-05 (×2): qty 1

## 2019-07-05 MED ORDER — DILTIAZEM HCL ER COATED BEADS 180 MG PO CP24
180.0000 mg | ORAL_CAPSULE | Freq: Two times a day (BID) | ORAL | Status: DC
  Administered 2019-07-05 (×2): 180 mg via ORAL
  Filled 2019-07-05 (×3): qty 1

## 2019-07-05 MED ORDER — ACETAMINOPHEN 325 MG PO TABS
650.0000 mg | ORAL_TABLET | Freq: Four times a day (QID) | ORAL | Status: DC | PRN
  Filled 2019-07-05: qty 2

## 2019-07-05 MED ORDER — ATORVASTATIN CALCIUM 10 MG PO TABS
20.0000 mg | ORAL_TABLET | Freq: Every day | ORAL | Status: DC
  Administered 2019-07-05: 20 mg via ORAL
  Filled 2019-07-05: qty 2

## 2019-07-05 MED ORDER — ENOXAPARIN SODIUM 60 MG/0.6ML ~~LOC~~ SOLN
60.0000 mg | SUBCUTANEOUS | Status: DC
  Administered 2019-07-05: 60 mg via SUBCUTANEOUS
  Filled 2019-07-05 (×2): qty 0.6

## 2019-07-05 MED ORDER — ASPIRIN EC 81 MG PO TBEC
81.0000 mg | DELAYED_RELEASE_TABLET | Freq: Every day | ORAL | Status: DC
  Administered 2019-07-05 – 2019-07-06 (×2): 81 mg via ORAL
  Filled 2019-07-05 (×2): qty 1

## 2019-07-05 MED ORDER — ONDANSETRON HCL 4 MG/2ML IJ SOLN: 4.0000 mg | Freq: Four times a day (QID) | INTRAMUSCULAR | Status: DC | PRN

## 2019-07-05 MED ORDER — AMLODIPINE BESYLATE 10 MG PO TABS
10.0000 mg | ORAL_TABLET | Freq: Every day | ORAL | Status: DC
  Administered 2019-07-05 – 2019-07-06 (×2): 10 mg via ORAL
  Filled 2019-07-05: qty 1

## 2019-07-05 MED ORDER — CARVEDILOL 6.25 MG PO TABS
6.2500 mg | ORAL_TABLET | Freq: Two times a day (BID) | ORAL | Status: DC
  Administered 2019-07-05 – 2019-07-06 (×2): 6.25 mg via ORAL
  Filled 2019-07-05 (×2): qty 1

## 2019-07-05 NOTE — Consult Note (Signed)
Cardiology Consultation:   Patient ID: Ryan Gay MRN: [id number]; DOB: [birth year]  Admit date: 07/04/2019 Date of Consult: 07/05/2019  Primary Care Provider: Soundra Pilon, FNP Primary Cardiologist: No primary care provider on file.  Primary Electrophysiologist:  None    Patient Profile:   Ryan Gay is a 29 y.o. male with a hx of HTN,  HLD who is being seen today for the evaluation of chest pain at the request of Dr. Toniann Fail.  History of Present Illness:   Ryan Gay has never seen a cardiologist before. He has a history of HTN and HLD. Currently on lipitor 20 mg, diltiazem 180 mg BID, and chlorthalidone 25 mg daily.   Yesterday around 4 pm, he first noticed his heart was racing. Checked his pulse, and it was ~150 bpm. He took his blood pressure and it was >200/100. He subsequently developed left-sided chest pain described as pressure-like with radiation into his left arm and jaw. Endorsed associated shortness of breath and diaphoresis. States it felt almost like a panic attack. He was sitting down when the pain began, had eaten approximately 45 minutes before. He had similar episodes that lasted a few minutes at a time 2 days prior and noted his blood pressure was elevated during these as well. Yesterday's pain persisted which is what prompted him to come to the ED. He received nitro in route with mild relief. He later received morphine and ativan which helped significantly. His pain completely resolved early this morning around midnight once the nitro drip had been started. He has not had any recurrence of chest pain. He has been compliant with his blood pressure medications.  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 He has not had issues with dyspnea on exertion, exertional chest pain, light headedness, headaches or lower extremity swelling.   Patient was diagnosed with hypertension at the age of 70 or 11. He had work-up at  that time which was negative for secondary causes. His wife who was on the phone during the interview said that he was recently worked up for sleep apnea and has been using CPAP for a little over a month now. She mentions his blood pressure has never been well controlled. She has never seen in below systolic of 170.   Patient vapes daily. Endorses significant EtOH use, but wife states he has worked to cut back this last month. He has a significant family history of heart disease. His father had an MI at the age of 15.   ED course: found to be severely hypertensive at 204/129. Received labetalol and clonidine initially, but remained hypertensive. He was placed on nitro drip with improvement in chest pain. Other vitals remained stable throughout. CBC normal. CMP with mildly elevated LFTs but otherwise normal. High sensitivity troponin negative x2. He had a CTA of the chest, abd, pelvis which was negative for PE or aortic dissection.     Heart Pathway Score:  HEAR Score: 4  Past Medical History:  Diagnosis Date  . Anxiety   . Depression   . High cholesterol   . Hypertension   . Seizures (HCC)     Past Surgical History:  Procedure Laterality Date  . WISDOM TOOTH EXTRACTION       Home Medications:  Prior to Admission medications   Medication Sig Start Date End Date Taking? Authorizing Provider  atorvastatin (LIPITOR) 20 MG tablet Take 20 mg by mouth daily. 03/01/19  Yes  [provider]  CARTIA XT 180 MG 24 hr capsule Take 180 mg by mouth 2 (two) times daily.  11/18/16  Yes [provider]  chlorthalidone (HYGROTON) 25 MG tablet Take 25 mg by mouth daily.  11/18/16  Yes [provider]  MAGNESIUM PO Take 1 tablet by mouth daily.   Yes [provider]  VITAMIN D PO Take 1 tablet by mouth daily.   Yes [provider]    Inpatient Medications: Scheduled Meds: . aspirin EC  81 mg Oral Daily  . atorvastatin  20 mg Oral q1800  . chlorthalidone  25 mg  Oral Daily  . diltiazem  180 mg Oral BID  . enoxaparin (LOVENOX) injection  60 mg Subcutaneous Q24H   Continuous Infusions: . nitroGLYCERIN 40 mcg/min (07/05/19 1055)   PRN Meds: acetaminophen **OR** acetaminophen, ondansetron **OR** ondansetron (ZOFRAN) IV  Allergies:   No Known Allergies  Social History:   Social History   Socioeconomic History  . Marital status: Married    Spouse name: Not on file  . Number of children: 2  . Years of education: Not on file  . Highest education level: Not on file  Occupational History  . Occupation: [employer]  Tobacco Use  . Smoking status: Former Smoker    Quit date: 10/03/2016    Years since quitting: 2.7  . Smokeless tobacco: Never Used  Substance and Sexual Activity  . Alcohol use: Yes    Comment: 4 drinks per wk  . Drug use: No  . Sexual activity: Yes    Partners: Female  Other Topics Concern  . Not on file  Social History Narrative   Lives at home w/ wife and kids   Right-handed   Caffeine: 1 Red Bull daily   Social Determinants of Health   Financial Resource Strain:   . Difficulty of Paying Living Expenses: Not on file  Food Insecurity:   . Worried About Programme researcher, broadcasting/film/video in the Last Year: Not on file  . Ran Out of Food in the Last Year: Not on file  Transportation Needs:   . Lack of Transportation (Medical): Not on file  . Lack of Transportation (Non-Medical): Not on file  Physical Activity:   . Days of Exercise per Week: Not on file  . Minutes of Exercise per Session: Not on file  Stress:   . Feeling of Stress : Not on file  Social Connections:   . Frequency of Communication with Friends and Family: Not on file  . Frequency of Social Gatherings with Friends and Family: Not on file  . Attends Religious Services: Not on file  . Active Member of Clubs or Organizations: Not on file  . Attends Banker Meetings: Not on file  . Marital Status: Not on file  Intimate Partner Violence:   . Fear  of Current or Ex-Partner: Not on file  . Emotionally Abused: Not on file  . Physically Abused: Not on file  . Sexually Abused: Not on file    Family History:    Family History  Problem Relation Age of Onset  . Hypertension Mother   . Hypothyroidism Mother   . Hypertension Father   . Heart disease Father   . Liver cancer Maternal Grandfather      ROS:  Please see the history of present illness.  All other ROS reviewed and negative.     Physical Exam/Data:   Vitals:   07/05/19 1015 07/05/19 1045 07/05/19 1100 07/05/19  1115  BP: (!) 158/91 (!) 159/86 (!) 143/96 (!) 148/119  Pulse: 67 82 70 92  Resp: (!) 25 19 (!) 21 (!) 22  Temp:      TempSrc:      SpO2: 94% 94% 93% 96%  Weight:      Height:       No intake or output data in the 24 hours ending 07/05/19 1146 Last 3 Weights 07/04/2019 06/02/2017 12/30/2016  Weight (lbs) 300 lb 249 lb 12.8 oz 263 lb 3.2 oz  Weight (kg) 136.079 kg 113.309 kg 119.387 kg     Body mass index is 38.52 kg/m.  General:  Well nourished, well developed, in no acute distress HEENT: normal Lymph: no adenopathy Neck: no JVD Endocrine:  No thryomegaly Vascular: No carotid bruits; FA pulses 2+ bilaterally without bruits  Cardiac:  normal S1, S2; RRR; no murmur  Lungs:  clear to auscultation bilaterally, no wheezing, rhonchi or rales  Abd: soft, nontender, no hepatomegaly  Ext: no edema Musculoskeletal:  No deformities, BUE and BLE strength normal and equal Skin: warm and dry  Neuro:  CNs 2-12 intact, no focal abnormalities noted Psych:  Normal affect   EKG:  The EKG was personally reviewed and demonstrates:  NSR without acute ischemic chagnes   Relevant CV Studies:   Laboratory Data:  High Sensitivity Troponin:   Recent Labs  Lab 07/04/19 1711 07/04/19 1949 07/05/19 0037 07/05/19 0321  TROPONINIHS 4 7 6 5      Chemistry Recent Labs  Lab 07/04/19 1711 07/05/19 0037 07/05/19 0528  NA 136  --  139  K 3.4*  --  3.6  CL 99  --  102   CO2 22  --  24  GLUCOSE 177*  --  117*  BUN 11  --  11  CREATININE 0.89 0.91 0.92  CALCIUM 9.2  --  9.1  GFRNONAA >60 >60 >60  GFRAA >60 >60 >60  ANIONGAP 15  --  13    Recent Labs  Lab 07/05/19 0528  PROT 6.2*  ALBUMIN 3.4*  AST 61*  ALT 87*  ALKPHOS 83  BILITOT 1.0   Hematology Recent Labs  Lab 07/04/19 1711 07/05/19 0037 07/05/19 0528  WBC 9.1 7.3 7.3  RBC 5.27 4.80 4.90  HGB 16.2 14.6 14.9  HCT 45.6 41.9 42.9  MCV 86.5 87.3 87.6  MCH 30.7 30.4 30.4  MCHC 35.5 34.8 34.7  RDW 12.6 12.9 12.9  PLT 278 220 215   BNPNo results for input(s): BNP, PROBNP in the last 168 hours.  DDimer No results for input(s): DDIMER in the last 168 hours.   Radiology/Studies:  DG Chest 2 View  Result Date: 07/04/2019 CLINICAL DATA:  Chest pain. Shortness of breath. Diaphoresis. The pain radiates into the left jaw. EXAM: CHEST - 2 VIEW COMPARISON:  None FINDINGS: The heart size and pulmonary vascularity are normal. Slight atelectasis at the right lung base, likely due to a shallow inspiration. The lungs are otherwise clear. No effusions. No bone abnormality. IMPRESSION: Minimal atelectasis at the right lung base. Electronically Signed   By: Lorriane Shire M.D.   On: 07/04/2019 19:48   CT Angio Chest PE W/Cm &/Or Wo Cm  Result Date: 07/04/2019 CLINICAL DATA:  29 year old male with shortness of breath. Concern for aortic dissection or pulmonary embolism. EXAM: CT ANGIOGRAPHY CHEST, ABDOMEN AND PELVIS TECHNIQUE: Multidetector CT imaging through the chest, abdomen and pelvis was performed using the standard protocol during bolus administration of intravenous contrast. Multiplanar reconstructed images  and MIPs were obtained and reviewed to evaluate the vascular anatomy. CONTRAST:  OMNIPAQUE IOHEXOL 350 MG/ML SOLN COMPARISON:  Chest radiograph dated 07/04/2019. FINDINGS: CTA CHEST FINDINGS Cardiovascular: There is no cardiomegaly or pericardial effusion. The thoracic aorta is unremarkable.  The origins of the great vessels of the aortic arch appear patent. There is common origin of the left common carotid artery and right brachiocephalic trunk. No CT evidence of pulmonary embolism. Mediastinum/Nodes: No hilar or mediastinal adenopathy. The esophagus is grossly unremarkable. No mediastinal fluid collection. Lungs/Pleura: There is eventration of the right hemidiaphragm. Bilateral diffuse hazy densities most consistent with atelectasis. Infiltrate is less likely. Clinical correlation is recommended. No focal consolidation, pleural effusion, or pneumothorax. The central airways are patent. Musculoskeletal: No chest wall abnormality. No acute or significant osseous findings. Review of the MIP images confirms the above findings. CTA ABDOMEN AND PELVIS FINDINGS VASCULAR Aorta: Normal caliber aorta without aneurysm, dissection, vasculitis or significant stenosis. Celiac: Patent without evidence of aneurysm, dissection, vasculitis or significant stenosis. SMA: Patent without evidence of aneurysm, dissection, vasculitis or significant stenosis. Renals: Both renal arteries are patent without evidence of aneurysm, dissection, vasculitis, fibromuscular dysplasia or significant stenosis. IMA: Patent without evidence of aneurysm, dissection, vasculitis or significant stenosis. Inflow: Patent without evidence of aneurysm, dissection, vasculitis or significant stenosis. Veins: The SMV, splenic vein, and main portal vein are patent. No portal venous gas. The IVC at is unremarkable for the degree of opacification. Review of the MIP images confirms the above findings. NON-VASCULAR No intra-abdominal free air or free fluid. Hepatobiliary: Diffuse fatty infiltration of the liver. No intrahepatic biliary ductal dilatation. The gallbladder is unremarkable. Pancreas: Unremarkable. No pancreatic ductal dilatation or surrounding inflammatory changes. Spleen: Normal in size without focal abnormality. Adrenals/Urinary Tract:  Adrenal glands are unremarkable. Kidneys are normal, without renal calculi, focal lesion, or hydronephrosis. Bladder is unremarkable. Stomach/Bowel: Stomach is within normal limits. Appendix appears normal. No evidence of bowel wall thickening, distention, or inflammatory changes. Lymphatic: No adenopathy. Reproductive: The prostate and seminal vesicles are grossly unremarkable. Other: None Musculoskeletal: No acute or significant osseous findings. Review of the MIP images confirms the above findings. IMPRESSION: 1. No acute intrathoracic, abdominal, or pelvic pathology. No pulmonary embolism or aortic dissection. 2. Fatty liver. Electronically Signed   By: Elgie Collard M.D.   On: 07/04/2019 21:33   CT Angio Chest/Abd/Pel for Dissection W and/or W/WO  Result Date: 07/04/2019 CLINICAL DATA:  29 year old male with shortness of breath. Concern for aortic dissection or pulmonary embolism. EXAM: CT ANGIOGRAPHY CHEST, ABDOMEN AND PELVIS TECHNIQUE: Multidetector CT imaging through the chest, abdomen and pelvis was performed using the standard protocol during bolus administration of intravenous contrast. Multiplanar reconstructed images and MIPs were obtained and reviewed to evaluate the vascular anatomy. CONTRAST:  OMNIPAQUE IOHEXOL 350 MG/ML SOLN COMPARISON:  Chest radiograph dated 07/04/2019. FINDINGS: CTA CHEST FINDINGS Cardiovascular: There is no cardiomegaly or pericardial effusion. The thoracic aorta is unremarkable. The origins of the great vessels of the aortic arch appear patent. There is common origin of the left common carotid artery and right brachiocephalic trunk. No CT evidence of pulmonary embolism. Mediastinum/Nodes: No hilar or mediastinal adenopathy. The esophagus is grossly unremarkable. No mediastinal fluid collection. Lungs/Pleura: There is eventration of the right hemidiaphragm. Bilateral diffuse hazy densities most consistent with atelectasis. Infiltrate is less likely. Clinical  correlation is recommended. No focal consolidation, pleural effusion, or pneumothorax. The central airways are patent. Musculoskeletal: No chest wall abnormality. No acute or significant osseous findings. Review  of the MIP images confirms the above findings. CTA ABDOMEN AND PELVIS FINDINGS VASCULAR Aorta: Normal caliber aorta without aneurysm, dissection, vasculitis or significant stenosis. Celiac: Patent without evidence of aneurysm, dissection, vasculitis or significant stenosis. SMA: Patent without evidence of aneurysm, dissection, vasculitis or significant stenosis. Renals: Both renal arteries are patent without evidence of aneurysm, dissection, vasculitis, fibromuscular dysplasia or significant stenosis. IMA: Patent without evidence of aneurysm, dissection, vasculitis or significant stenosis. Inflow: Patent without evidence of aneurysm, dissection, vasculitis or significant stenosis. Veins: The SMV, splenic vein, and main portal vein are patent. No portal venous gas. The IVC at is unremarkable for the degree of opacification. Review of the MIP images confirms the above findings. NON-VASCULAR No intra-abdominal free air or free fluid. Hepatobiliary: Diffuse fatty infiltration of the liver. No intrahepatic biliary ductal dilatation. The gallbladder is unremarkable. Pancreas: Unremarkable. No pancreatic ductal dilatation or surrounding inflammatory changes. Spleen: Normal in size without focal abnormality. Adrenals/Urinary Tract: Adrenal glands are unremarkable. Kidneys are normal, without renal calculi, focal lesion, or hydronephrosis. Bladder is unremarkable. Stomach/Bowel: Stomach is within normal limits. Appendix appears normal. No evidence of bowel wall thickening, distention, or inflammatory changes. Lymphatic: No adenopathy. Reproductive: The prostate and seminal vesicles are grossly unremarkable. Other: None Musculoskeletal: No acute or significant osseous findings. Review of the MIP images confirms the  above findings. IMPRESSION: 1. No acute intrathoracic, abdominal, or pelvic pathology. No pulmonary embolism or aortic dissection. 2. Fatty liver. Electronically Signed   By: Elgie Collard M.D.   On: 07/04/2019 21:33   { HEAR Score (for undifferentiated chest pain):  HEAR Score: 4    Assessment and Plan:   1. Chest pain: ACS ruled out with negative troponins and non-ischemic EKG. Seems likely in the setting of hypertensive urgency. Has not had recurrence of chest pain in >12 hours. Agree with medical observation overnight. Wean nitro drip as able. Given his significant family history, would recommend coronary CT as an outpatient once blood pressure is better controlled.  2. Hypertensive urgency: improving on nitro drip and initiation of PO meds. Will need escalation of medical therapy. Will start by switching from dilt to amlodipine and starting Coreg. Continue chlorthalidone. Could add ACE at follow-up if continues to be uncontrolled. He has also started using CPAP at night the last month with diagnosis of REM hypoxia which will also hopefully aid in blood pressure control. He was counseled on lifestyle modifications, including weight loss and cutting back on EtOH use.  3. HLD: continue lipitor 20 mg daily    For questions or updates, please contact [facility] Please consult [email or web address] for contact info under     Signed, Bridget Hartshorn, DO  07/05/2019 11:46 AM

## 2019-07-05 NOTE — Progress Notes (Signed)
Patient arrived to unit, current vitals are as follows:   07/05/19 1404  Vitals  Temp 98.1 F (36.7 C)  Temp Source Oral  BP (!) 138/94  MAP (mmHg) 106  BP Location Right Arm  BP Method Automatic  Patient Position (if appropriate) Sitting  Pulse Rate 92  Pulse Rate Source Monitor  Resp 14  Oxygen Therapy  SpO2 97 %  O2 Device Room Air  Pain Assessment  Pain Scale 0-10  Pain Score 0  MEWS Score  MEWS Temp 0  MEWS Systolic 0  MEWS Pulse 0  MEWS RR 0  MEWS LOC 0  MEWS Score 0  MEWS Score Color Green   Patient has Norvasc 10 mg due now. Per Blake Divine, MD, wean paitent off of Nitroglycerin drip and give Norvasc now. RN will carry out orders.

## 2019-07-05 NOTE — H&P (Signed)
History and Physical    Ryan Gay GYF:[id number] DOB: 1990-08-27 DOA: 07/04/2019  PCP: Soundra Pilon, FNP  Patient coming from: Home.  Chief Complaint: Chest pain.  HPI: Ryan Gay is a 29 y.o. male with history of hypertension, hyperlipidemia presents to the ER because of chest pain.  Patient states he has been having chest pain off and on last few days but last evening around 4 PM chest pain became more intense and persistent.  Retrosternal radiating to both back shoulders with some diaphoresis.  Denies any associated fever chills shortness of breath nausea vomiting or abdominal pain.  Given the pain patient presents to the ER.  ED Course: Chest pain improved with sublingual nitroglycerin.  Blood pressure was found to be markedly elevated with systolic blood pressure more than 190/130 was initially started on Cardene infusion changed to nitroglycerin.  EKG shows normal sinus rhythm with nonspecific ST-T changes.  High-sensitivity troponin was negative.  Covid test was negative.  Patient admitted for chest pain and hypertensive urgency.  Labs show mild hypokalemia of 3.4 blood sugar 177.  Review of Systems: As per HPI, rest all negative.   Past Medical History:  Diagnosis Date   Anxiety    Depression    High cholesterol    Hypertension    Seizures (HCC)     Past Surgical History:  Procedure Laterality Date   WISDOM TOOTH EXTRACTION       reports that he quit smoking about 2 years ago. He has never used smokeless tobacco. He reports current alcohol use. He reports that he does not use drugs.  No Known Allergies  Family History  Problem Relation Age of Onset   Hypertension Mother    Hypothyroidism Mother    Hypertension Father    Heart disease Father    Liver cancer Maternal Grandfather     Prior to Admission medications   Medication Sig Start Date End Date Taking? Authorizing Provider  atorvastatin (LIPITOR) 20 MG tablet Take 20 mg by mouth daily.  03/01/19  Yes [provider]  CARTIA XT 180 MG 24 hr capsule Take 360 mg by mouth 2 (two) times daily.  11/18/16  Yes [provider]  chlorthalidone (HYGROTON) 25 MG tablet Take 25 mg by mouth daily.  11/18/16  Yes [provider]  MAGNESIUM PO Take 1 tablet by mouth daily.   Yes [provider]  VITAMIN D PO Take 1 tablet by mouth daily.   Yes [provider]    Physical Exam: Constitutional: Moderately built and nourished. Vitals:   07/04/19 2315 07/04/19 2330 07/04/19 2345 07/05/19 0000  BP: (!) 166/117 (!) 167/117 (!) 166/114 (!) 142/99  Pulse:  90 90 93  Resp: (!) 26 17 18  (!) 26  Temp:      TempSrc:      SpO2:  96% 96% 92%  Weight:      Height:       Eyes: Anicteric no pallor. ENMT: No discharge from the ears eyes nose or mouth. Neck: No mass or.  No neck rigidity. Respiratory: No rhonchi or crepitations. Cardiovascular: S1-S2 heard. Abdomen: Soft nontender bowel sounds present. Musculoskeletal: No edema. Skin: No rash. Neurologic: Alert awake oriented time place and person.  Moves all extremities. Psychiatric: Appears normal with normal affect.   Labs on Admission: I have personally reviewed following labs and imaging studies  CBC: Recent Labs  Lab 07/04/19 1711  WBC 9.1  HGB 16.2  HCT 45.6  MCV 86.5  PLT  278   Basic Metabolic Panel: Recent Labs  Lab 07/04/19 1711  NA 136  K 3.4*  CL 99  CO2 22  GLUCOSE 177*  BUN 11  CREATININE 0.89  CALCIUM 9.2   GFR: Estimated Creatinine Clearance: 178.2 mL/min (by C-G formula based on SCr of 0.89 mg/dL). Liver Function Tests: No results for input(s): AST, ALT, ALKPHOS, BILITOT, PROT, ALBUMIN in the last 168 hours. No results for input(s): LIPASE, AMYLASE in the last 168 hours. No results for input(s): AMMONIA in the last 168 hours. Coagulation Profile: No results for input(s): INR, PROTIME in the last 168 hours. Cardiac Enzymes: No results for input(s): CKTOTAL,  CKMB, CKMBINDEX, TROPONINI in the last 168 hours. BNP (last 3 results) No results for input(s): PROBNP in the last 8760 hours. HbA1C: No results for input(s): HGBA1C in the last 72 hours. CBG: No results for input(s): GLUCAP in the last 168 hours. Lipid Profile: No results for input(s): CHOL, HDL, LDLCALC, TRIG, CHOLHDL, LDLDIRECT in the last 72 hours. Thyroid Function Tests: No results for input(s): TSH, T4TOTAL, FREET4, T3FREE, THYROIDAB in the last 72 hours. Anemia Panel: No results for input(s): VITAMINB12, FOLATE, FERRITIN, TIBC, IRON, RETICCTPCT in the last 72 hours. Urine analysis: No results found for: COLORURINE, APPEARANCEUR, LABSPEC, PHURINE, GLUCOSEU, HGBUR, BILIRUBINUR, KETONESUR, PROTEINUR, UROBILINOGEN, NITRITE, LEUKOCYTESUR Sepsis Labs: @LABRCNTIP (procalcitonin:4,lacticidven:4) )No results found for this or any previous visit (from the past 240 hour(s)).   Radiological Exams on Admission: DG Chest 2 View  Result Date: 07/04/2019 CLINICAL DATA:  Chest pain. Shortness of breath. Diaphoresis. The pain radiates into the left jaw. EXAM: CHEST - 2 VIEW COMPARISON:  None FINDINGS: The heart size and pulmonary vascularity are normal. Slight atelectasis at the right lung base, likely due to a shallow inspiration. The lungs are otherwise clear. No effusions. No bone abnormality. IMPRESSION: Minimal atelectasis at the right lung base. Electronically Signed   By: 07/06/2019 M.D.   On: 07/04/2019 19:48   CT Angio Chest PE W/Cm &/Or Wo Cm  Result Date: 07/04/2019 CLINICAL DATA:  29 year old male with shortness of breath. Concern for aortic dissection or pulmonary embolism. EXAM: CT ANGIOGRAPHY CHEST, ABDOMEN AND PELVIS TECHNIQUE: Multidetector CT imaging through the chest, abdomen and pelvis was performed using the standard protocol during bolus administration of intravenous contrast. Multiplanar reconstructed images and MIPs were obtained and reviewed to evaluate the vascular  anatomy. CONTRAST:  26 OMNIPAQUE IOHEXOL 350 MG/ML SOLN COMPARISON:  Chest radiograph dated 07/04/2019. FINDINGS: CTA CHEST FINDINGS Cardiovascular: There is no cardiomegaly or pericardial effusion. The thoracic aorta is unremarkable. The origins of the great vessels of the aortic arch appear patent. There is common origin of the left common carotid artery and right brachiocephalic trunk. No CT evidence of pulmonary embolism. Mediastinum/Nodes: No hilar or mediastinal adenopathy. The esophagus is grossly unremarkable. No mediastinal fluid collection. Lungs/Pleura: There is eventration of the right hemidiaphragm. Bilateral diffuse hazy densities most consistent with atelectasis. Infiltrate is less likely. Clinical correlation is recommended. No focal consolidation, pleural effusion, or pneumothorax. The central airways are patent. Musculoskeletal: No chest wall abnormality. No acute or significant osseous findings. Review of the MIP images confirms the above findings. CTA ABDOMEN AND PELVIS FINDINGS VASCULAR Aorta: Normal caliber aorta without aneurysm, dissection, vasculitis or significant stenosis. Celiac: Patent without evidence of aneurysm, dissection, vasculitis or significant stenosis. SMA: Patent without evidence of aneurysm, dissection, vasculitis or significant stenosis. Renals: Both renal arteries are patent without evidence of aneurysm, dissection, vasculitis, fibromuscular dysplasia or significant stenosis. IMA:  Patent without evidence of aneurysm, dissection, vasculitis or significant stenosis. Inflow: Patent without evidence of aneurysm, dissection, vasculitis or significant stenosis. Veins: The SMV, splenic vein, and main portal vein are patent. No portal venous gas. The IVC at is unremarkable for the degree of opacification. Review of the MIP images confirms the above findings. NON-VASCULAR No intra-abdominal free air or free fluid. Hepatobiliary: Diffuse fatty infiltration of the liver. No  intrahepatic biliary ductal dilatation. The gallbladder is unremarkable. Pancreas: Unremarkable. No pancreatic ductal dilatation or surrounding inflammatory changes. Spleen: Normal in size without focal abnormality. Adrenals/Urinary Tract: Adrenal glands are unremarkable. Kidneys are normal, without renal calculi, focal lesion, or hydronephrosis. Bladder is unremarkable. Stomach/Bowel: Stomach is within normal limits. Appendix appears normal. No evidence of bowel wall thickening, distention, or inflammatory changes. Lymphatic: No adenopathy. Reproductive: The prostate and seminal vesicles are grossly unremarkable. Other: None Musculoskeletal: No acute or significant osseous findings. Review of the MIP images confirms the above findings. IMPRESSION: 1. No acute intrathoracic, abdominal, or pelvic pathology. No pulmonary embolism or aortic dissection. 2. Fatty liver. Electronically Signed   By: Anner Crete M.D.   On: 07/04/2019 21:33   CT Angio Chest/Abd/Pel for Dissection W and/or W/WO  Result Date: 07/04/2019 CLINICAL DATA:  29 year old male with shortness of breath. Concern for aortic dissection or pulmonary embolism. EXAM: CT ANGIOGRAPHY CHEST, ABDOMEN AND PELVIS TECHNIQUE: Multidetector CT imaging through the chest, abdomen and pelvis was performed using the standard protocol during bolus administration of intravenous contrast. Multiplanar reconstructed images and MIPs were obtained and reviewed to evaluate the vascular anatomy. CONTRAST:  153mL OMNIPAQUE IOHEXOL 350 MG/ML SOLN COMPARISON:  Chest radiograph dated 07/04/2019. FINDINGS: CTA CHEST FINDINGS Cardiovascular: There is no cardiomegaly or pericardial effusion. The thoracic aorta is unremarkable. The origins of the great vessels of the aortic arch appear patent. There is common origin of the left common carotid artery and right brachiocephalic trunk. No CT evidence of pulmonary embolism. Mediastinum/Nodes: No hilar or mediastinal adenopathy. The  esophagus is grossly unremarkable. No mediastinal fluid collection. Lungs/Pleura: There is eventration of the right hemidiaphragm. Bilateral diffuse hazy densities most consistent with atelectasis. Infiltrate is less likely. Clinical correlation is recommended. No focal consolidation, pleural effusion, or pneumothorax. The central airways are patent. Musculoskeletal: No chest wall abnormality. No acute or significant osseous findings. Review of the MIP images confirms the above findings. CTA ABDOMEN AND PELVIS FINDINGS VASCULAR Aorta: Normal caliber aorta without aneurysm, dissection, vasculitis or significant stenosis. Celiac: Patent without evidence of aneurysm, dissection, vasculitis or significant stenosis. SMA: Patent without evidence of aneurysm, dissection, vasculitis or significant stenosis. Renals: Both renal arteries are patent without evidence of aneurysm, dissection, vasculitis, fibromuscular dysplasia or significant stenosis. IMA: Patent without evidence of aneurysm, dissection, vasculitis or significant stenosis. Inflow: Patent without evidence of aneurysm, dissection, vasculitis or significant stenosis. Veins: The SMV, splenic vein, and main portal vein are patent. No portal venous gas. The IVC at is unremarkable for the degree of opacification. Review of the MIP images confirms the above findings. NON-VASCULAR No intra-abdominal free air or free fluid. Hepatobiliary: Diffuse fatty infiltration of the liver. No intrahepatic biliary ductal dilatation. The gallbladder is unremarkable. Pancreas: Unremarkable. No pancreatic ductal dilatation or surrounding inflammatory changes. Spleen: Normal in size without focal abnormality. Adrenals/Urinary Tract: Adrenal glands are unremarkable. Kidneys are normal, without renal calculi, focal lesion, or hydronephrosis. Bladder is unremarkable. Stomach/Bowel: Stomach is within normal limits. Appendix appears normal. No evidence of bowel wall thickening, distention, or  inflammatory changes. Lymphatic: No adenopathy.  Reproductive: The prostate and seminal vesicles are grossly unremarkable. Other: None Musculoskeletal: No acute or significant osseous findings. Review of the MIP images confirms the above findings. IMPRESSION: 1. No acute intrathoracic, abdominal, or pelvic pathology. No pulmonary embolism or aortic dissection. 2. Fatty liver. Electronically Signed   By: Elgie Collard M.D.   On: 07/04/2019 21:33    EKG: Independently reviewed.  Normal sinus rhythm with nonspecific changes.  Assessment/Plan Principal Problem:   Hypertensive urgency Active Problems:   Hyperlipidemia   Chest pain    1. Chest pain -chest pain improved with sublingual nitroglycerin concerning for angina.  Presently chest pain-free will cycle cardiac markers consult cardiology.  On aspirin.  Patient also takes statins. 2. Hypertensive urgency -patient states he has not missed his home medications Cardizem and hydrochlorothiazide which will be continued.  Presently patient on nitroglycerin infusion.  Follow blood pressure trends.  CT angiogram of the chest was unremarkable. 3. Hyperlipidemia on statins. 4. Hyperglycemia check hemoglobin A1c.   DVT prophylaxis: Lovenox. Code Status: Full code. Family Communication: Discussed with patient. Disposition Plan: Home. Consults called: Cardiology. Admission status: Observation.   Eduard Clos MD [facility] Pager [phone].  If 7PM-7AM, please contact night-coverage [email or web address] Password [facility]  07/05/2019, 12:39 AM

## 2019-07-05 NOTE — Progress Notes (Signed)
Patient was admitted earlier this morning by Dr. Toniann Fail see his note for detailed H&P.   29 year old gentleman prior history of hypertension, hyperlipidemia, family history of CAD in his father at the age of 3 presents with substernal chest pain and hypertensive emergency. Was started on nitroglycerin gtt and admitted for evaluation of chest pain. Cardiology consulted to see if he need ischemic evaluation. Cardiology recommends changing antihypertensives to Norvasc and Coreg and continue with chlorthalidone.  Recommends outpatient CT angiography.   Kathlen Mody MD

## 2019-07-06 DIAGNOSIS — R0789 Other chest pain: Secondary | ICD-10-CM

## 2019-07-06 DIAGNOSIS — E782 Mixed hyperlipidemia: Secondary | ICD-10-CM

## 2019-07-06 DIAGNOSIS — M109 Gout, unspecified: Secondary | ICD-10-CM

## 2019-07-06 DIAGNOSIS — I161 Hypertensive emergency: Principal | ICD-10-CM

## 2019-07-06 LAB — CBC
HCT: 42.6 % (ref 39.0–52.0)
Hemoglobin: 14.7 g/dL (ref 13.0–17.0)
MCH: 30.2 pg (ref 26.0–34.0)
MCHC: 34.5 g/dL (ref 30.0–36.0)
MCV: 87.5 fL (ref 80.0–100.0)
Platelets: 226 10*3/uL (ref 150–400)
RBC: 4.87 MIL/uL (ref 4.22–5.81)
RDW: 12.7 % (ref 11.5–15.5)
WBC: 8.4 10*3/uL (ref 4.0–10.5)
nRBC: 0 % (ref 0.0–0.2)

## 2019-07-06 LAB — BASIC METABOLIC PANEL
Anion gap: 11 (ref 5–15)
BUN: 9 mg/dL (ref 6–20)
CO2: 28 mmol/L (ref 22–32)
Calcium: 9 mg/dL (ref 8.9–10.3)
Chloride: 100 mmol/L (ref 98–111)
Creatinine, Ser: 1.02 mg/dL (ref 0.61–1.24)
GFR calc Af Amer: >60 mL/min (ref >60)
GFR calc non Af Amer: >60 mL/min (ref >60)
Glucose, Bld: 123 mg/dL — ABNORMAL HIGH (ref 70–99)
Potassium: 3.5 mmol/L (ref 3.5–5.1)
Sodium: 139 mmol/L (ref 135–145)

## 2019-07-06 LAB — URIC ACID: Uric Acid, Serum: 10.4 mg/dL — ABNORMAL HIGH (ref 3.7–8.6)

## 2019-07-06 MED ORDER — HYDRALAZINE HCL 25 MG PO TABS: 25.0000 mg | ORAL_TABLET | Freq: Three times a day (TID) | ORAL | Status: DC | PRN

## 2019-07-06 MED ORDER — NAPROXEN 250 MG PO TABS
250.0000 mg | ORAL_TABLET | Freq: Once | ORAL | Status: DC
  Filled 2019-07-06: qty 1

## 2019-07-06 MED ORDER — LOSARTAN POTASSIUM 25 MG PO TABS
25.0000 mg | ORAL_TABLET | Freq: Every day | ORAL | Status: DC
  Administered 2019-07-06: 25 mg via ORAL
  Filled 2019-07-06: qty 1

## 2019-07-06 MED ORDER — AMLODIPINE BESYLATE 10 MG PO TABS: 10.0000 mg | ORAL_TABLET | Freq: Every day | ORAL | 1 refills | Status: DC

## 2019-07-06 MED ORDER — CARVEDILOL 6.25 MG PO TABS: 6.2500 mg | ORAL_TABLET | Freq: Two times a day (BID) | ORAL | 1 refills | Status: DC

## 2019-07-06 MED ORDER — LOSARTAN POTASSIUM 25 MG PO TABS: 25.0000 mg | ORAL_TABLET | Freq: Every day | ORAL | 1 refills | Status: DC

## 2019-07-06 MED ORDER — METOPROLOL TARTRATE 5 MG/5ML IV SOLN
5.0000 mg | INTRAVENOUS | Status: DC | PRN
  Administered 2019-07-06: 5 mg via INTRAVENOUS
  Filled 2019-07-06: qty 5

## 2019-07-06 MED ORDER — COLCHICINE 0.6 MG PO TABS
0.6000 mg | ORAL_TABLET | Freq: Two times a day (BID) | ORAL | Status: DC
  Administered 2019-07-06: 0.6 mg via ORAL
  Filled 2019-07-06: qty 1

## 2019-07-06 MED ORDER — COLCHICINE 0.6 MG PO TABS: 0.6000 mg | ORAL_TABLET | Freq: Two times a day (BID) | ORAL | 0 refills | Status: DC

## 2019-07-06 MED ORDER — ASPIRIN 81 MG PO TBEC: 81.0000 mg | DELAYED_RELEASE_TABLET | Freq: Every day | ORAL | 1 refills | Status: DC

## 2019-07-06 MED FILL — LOSARTAN POTASSIUM 25 MG TA: 25 | 30 days supply | Qty: 30 | Fill #0

## 2019-07-06 MED FILL — CARVEDILOL 6.25 MG TABLET: 6.25 | 30 days supply | Qty: 60 | Fill #0

## 2019-07-06 MED FILL — AMLODIPINE BESYLATE 10 MG T: 10 | 30 days supply | Qty: 30 | Fill #0

## 2019-07-06 MED FILL — ASPIRIN 81 MG TBEC: 81 | 30 days supply | Qty: 30 | Fill #0

## 2019-07-06 MED FILL — COLCHICINE 0.6 MG TABS: 0.6 | 15 days supply | Qty: 30 | Fill #0

## 2019-07-06 NOTE — Plan of Care (Signed)
  Problem: Education: Goal: Knowledge of General Education information will improve Description Including pain rating scale, medication(s)/side effects and non-pharmacologic comfort measures Outcome: Progressing   

## 2019-07-06 NOTE — Progress Notes (Addendum)
Progress Note  Patient Name: Ryan Gay Date of Encounter: 07/06/2019  Primary Cardiologist: No primary care provider on file.   Subjective   Did not sleep well overnight due to gout flare in his toe. No recurrent chest pain. Denies shortness of breath, headaches, light headedness or vision changes. Looks forward to being able to get home.   Inpatient Medications    Scheduled Meds: . amLODipine  10 mg Oral Daily  . aspirin EC  81 mg Oral Daily  . atorvastatin  20 mg Oral q1800  . carvedilol  6.25 mg Oral BID WC  . chlorthalidone  25 mg Oral Daily  . enoxaparin (LOVENOX) injection  60 mg Subcutaneous Q24H   Continuous Infusions: . nitroGLYCERIN Stopped (07/05/19 1846)   PRN Meds: acetaminophen **OR** acetaminophen, metoprolol tartrate, ondansetron **OR** ondansetron (ZOFRAN) IV   Vital Signs    Vitals:   07/06/19 0020 07/06/19 0400 07/06/19 0401 07/06/19 0804  BP: (!) 174/106 (!) 176/99    Pulse: 90 71    Resp: 16 (!) 25    Temp: 97.9 F (36.6 C)   98.2 F (36.8 C)  TempSrc: Oral   Oral  SpO2: 98% 93%    Weight:   (!) 137.7 kg   Height:        Intake/Output Summary (Last 24 hours) at 07/06/2019 0821 Last data filed at 07/06/2019 0600 Gross per 24 hour  Intake 396.15 ml  Output 900 ml  Net -503.85 ml   Last 3 Weights 07/06/2019 07/05/2019 07/05/2019  Weight (lbs) 303 lb 8 oz 305 lb 305 lb  Weight (kg) 137.667 kg 138.347 kg 138.347 kg      Telemetry    NSR - Personally Reviewed  ECG    No new tracings - Personally Reviewed  Physical Exam   GEN: No acute distress.   Neck: No JVD Cardiac: RRR, no murmurs, rubs, or gallops.  Respiratory: Clear to auscultation bilaterally. GI: Soft, nontender, non-distended  MS: No edema; No deformity. Warm, erythematous left hallux joint  Neuro:  Nonfocal  Psych: Normal affect   Labs    High Sensitivity Troponin:   Recent Labs  Lab 07/04/19 1711 07/04/19 1949 07/05/19 0037 07/05/19 0321  TROPONINIHS 4 7 6  5       Chemistry Recent Labs  Lab 07/04/19 1711 07/04/19 1711 07/05/19 0037 07/05/19 0528 07/06/19 0437  NA 136  --   --  139 139  K 3.4*  --   --  3.6 3.5  CL 99  --   --  102 100  CO2 22  --   --  24 28  GLUCOSE 177*  --   --  117* 123*  BUN 11  --   --  11 9  CREATININE 0.89   < > 0.91 0.92 1.02  CALCIUM 9.2  --   --  9.1 9.0  PROT  --   --   --  6.2*  --   ALBUMIN  --   --   --  3.4*  --   AST  --   --   --  61*  --   ALT  --   --   --  87*  --   ALKPHOS  --   --   --  83  --   BILITOT  --   --   --  1.0  --   GFRNONAA >60   < > >60 >60 >60  GFRAA >60   < > >60 >  60 >60  ANIONGAP 15  --   --  13 11   < > = values in this interval not displayed.     Hematology Recent Labs  Lab 07/05/19 0037 07/05/19 0528 07/06/19 0437  WBC 7.3 7.3 8.4  RBC 4.80 4.90 4.87  HGB 14.6 14.9 14.7  HCT 41.9 42.9 42.6  MCV 87.3 87.6 87.5  MCH 30.4 30.4 30.2  MCHC 34.8 34.7 34.5  RDW 12.9 12.9 12.7  PLT 220 215 226    BNPNo results for input(s): BNP, PROBNP in the last 168 hours.   DDimer No results for input(s): DDIMER in the last 168 hours.   Radiology    DG Chest 2 View  Result Date: 07/04/2019 CLINICAL DATA:  Chest pain. Shortness of breath. Diaphoresis. The pain radiates into the left jaw. EXAM: CHEST - 2 VIEW COMPARISON:  None FINDINGS: The heart size and pulmonary vascularity are normal. Slight atelectasis at the right lung base, likely due to a shallow inspiration. The lungs are otherwise clear. No effusions. No bone abnormality. IMPRESSION: Minimal atelectasis at the right lung base. Electronically Signed   By: Francene Boyers M.D.   On: 07/04/2019 19:48   CT Angio Chest PE W/Cm &/Or Wo Cm  Result Date: 07/04/2019 CLINICAL DATA:  29 year old male with shortness of breath. Concern for aortic dissection or pulmonary embolism. EXAM: CT ANGIOGRAPHY CHEST, ABDOMEN AND PELVIS TECHNIQUE: Multidetector CT imaging through the chest, abdomen and pelvis was performed using the  standard protocol during bolus administration of intravenous contrast. Multiplanar reconstructed images and MIPs were obtained and reviewed to evaluate the vascular anatomy. CONTRAST:  OMNIPAQUE IOHEXOL 350 MG/ML SOLN COMPARISON:  Chest radiograph dated 07/04/2019. FINDINGS: CTA CHEST FINDINGS Cardiovascular: There is no cardiomegaly or pericardial effusion. The thoracic aorta is unremarkable. The origins of the great vessels of the aortic arch appear patent. There is common origin of the left common carotid artery and right brachiocephalic trunk. No CT evidence of pulmonary embolism. Mediastinum/Nodes: No hilar or mediastinal adenopathy. The esophagus is grossly unremarkable. No mediastinal fluid collection. Lungs/Pleura: There is eventration of the right hemidiaphragm. Bilateral diffuse hazy densities most consistent with atelectasis. Infiltrate is less likely. Clinical correlation is recommended. No focal consolidation, pleural effusion, or pneumothorax. The central airways are patent. Musculoskeletal: No chest wall abnormality. No acute or significant osseous findings. Review of the MIP images confirms the above findings. CTA ABDOMEN AND PELVIS FINDINGS VASCULAR Aorta: Normal caliber aorta without aneurysm, dissection, vasculitis or significant stenosis. Celiac: Patent without evidence of aneurysm, dissection, vasculitis or significant stenosis. SMA: Patent without evidence of aneurysm, dissection, vasculitis or significant stenosis. Renals: Both renal arteries are patent without evidence of aneurysm, dissection, vasculitis, fibromuscular dysplasia or significant stenosis. IMA: Patent without evidence of aneurysm, dissection, vasculitis or significant stenosis. Inflow: Patent without evidence of aneurysm, dissection, vasculitis or significant stenosis. Veins: The SMV, splenic vein, and main portal vein are patent. No portal venous gas. The IVC at is unremarkable for the degree of opacification. Review of  the MIP images confirms the above findings. NON-VASCULAR No intra-abdominal free air or free fluid. Hepatobiliary: Diffuse fatty infiltration of the liver. No intrahepatic biliary ductal dilatation. The gallbladder is unremarkable. Pancreas: Unremarkable. No pancreatic ductal dilatation or surrounding inflammatory changes. Spleen: Normal in size without focal abnormality. Adrenals/Urinary Tract: Adrenal glands are unremarkable. Kidneys are normal, without renal calculi, focal lesion, or hydronephrosis. Bladder is unremarkable. Stomach/Bowel: Stomach is within normal limits. Appendix appears normal. No evidence of bowel wall thickening, distention,  or inflammatory changes. Lymphatic: No adenopathy. Reproductive: The prostate and seminal vesicles are grossly unremarkable. Other: None Musculoskeletal: No acute or significant osseous findings. Review of the MIP images confirms the above findings. IMPRESSION: 1. No acute intrathoracic, abdominal, or pelvic pathology. No pulmonary embolism or aortic dissection. 2. Fatty liver. Electronically Signed   By: Elgie Collard M.D.   On: 07/04/2019 21:33   CT Angio Chest/Abd/Pel for Dissection W and/or W/WO  Result Date: 07/04/2019 CLINICAL DATA:  29 year old male with shortness of breath. Concern for aortic dissection or pulmonary embolism. EXAM: CT ANGIOGRAPHY CHEST, ABDOMEN AND PELVIS TECHNIQUE: Multidetector CT imaging through the chest, abdomen and pelvis was performed using the standard protocol during bolus administration of intravenous contrast. Multiplanar reconstructed images and MIPs were obtained and reviewed to evaluate the vascular anatomy. CONTRAST:  OMNIPAQUE IOHEXOL 350 MG/ML SOLN COMPARISON:  Chest radiograph dated 07/04/2019. FINDINGS: CTA CHEST FINDINGS Cardiovascular: There is no cardiomegaly or pericardial effusion. The thoracic aorta is unremarkable. The origins of the great vessels of the aortic arch appear patent. There is common origin of the  left common carotid artery and right brachiocephalic trunk. No CT evidence of pulmonary embolism. Mediastinum/Nodes: No hilar or mediastinal adenopathy. The esophagus is grossly unremarkable. No mediastinal fluid collection. Lungs/Pleura: There is eventration of the right hemidiaphragm. Bilateral diffuse hazy densities most consistent with atelectasis. Infiltrate is less likely. Clinical correlation is recommended. No focal consolidation, pleural effusion, or pneumothorax. The central airways are patent. Musculoskeletal: No chest wall abnormality. No acute or significant osseous findings. Review of the MIP images confirms the above findings. CTA ABDOMEN AND PELVIS FINDINGS VASCULAR Aorta: Normal caliber aorta without aneurysm, dissection, vasculitis or significant stenosis. Celiac: Patent without evidence of aneurysm, dissection, vasculitis or significant stenosis. SMA: Patent without evidence of aneurysm, dissection, vasculitis or significant stenosis. Renals: Both renal arteries are patent without evidence of aneurysm, dissection, vasculitis, fibromuscular dysplasia or significant stenosis. IMA: Patent without evidence of aneurysm, dissection, vasculitis or significant stenosis. Inflow: Patent without evidence of aneurysm, dissection, vasculitis or significant stenosis. Veins: The SMV, splenic vein, and main portal vein are patent. No portal venous gas. The IVC at is unremarkable for the degree of opacification. Review of the MIP images confirms the above findings. NON-VASCULAR No intra-abdominal free air or free fluid. Hepatobiliary: Diffuse fatty infiltration of the liver. No intrahepatic biliary ductal dilatation. The gallbladder is unremarkable. Pancreas: Unremarkable. No pancreatic ductal dilatation or surrounding inflammatory changes. Spleen: Normal in size without focal abnormality. Adrenals/Urinary Tract: Adrenal glands are unremarkable. Kidneys are normal, without renal calculi, focal lesion, or  hydronephrosis. Bladder is unremarkable. Stomach/Bowel: Stomach is within normal limits. Appendix appears normal. No evidence of bowel wall thickening, distention, or inflammatory changes. Lymphatic: No adenopathy. Reproductive: The prostate and seminal vesicles are grossly unremarkable. Other: None Musculoskeletal: No acute or significant osseous findings. Review of the MIP images confirms the above findings. IMPRESSION: 1. No acute intrathoracic, abdominal, or pelvic pathology. No pulmonary embolism or aortic dissection. 2. Fatty liver. Electronically Signed   By: Elgie Collard M.D.   On: 07/04/2019 21:33    Cardiac Studies     Patient Profile     29 y.o. male with history of uncontrolled HTN and HLD presented with 3 days of progressive chest pain in the setting of hypertensive urgency  Assessment & Plan    1. Chest pain -ACS ruled out with negative troponins and non-ischemic EKG -has not had recurrence of pain since improvement in blood pressure -plan for outpatient ischemic  eval with coronary CTA   2. Hypertensive urgency -blood pressures improving but remain elevated; values now are around what he has been running at home -he is no longer symptomatic -heart rates well controlled on Coreg dose -will start Losartan which should also have dual benefit of lowering uric acid levels for his chronic gout -BP should continue to improve over the next few days with new medications. Weight loss and decreased alcohol use will also be important lifestyle modifications for him to continue working on.   3. HLD: LDL well controlled on lipitor 20 mg daily     For questions or updates, please contact [place] Please consult [email or web address] for contact info under        Signed, Delice Bison, DO  07/06/2019, 8:21 AM

## 2019-07-06 NOTE — Discharge Summary (Addendum)
Physician Discharge Summary  Reece Fehnel QMV:[id number] DOB: 03-16-1991 DOA: 07/04/2019  PCP: Soundra Pilon, FNP  Admit date: 07/04/2019 Discharge date: 07/07/2019  Admitted From: Home.  Disposition:  Home.   Recommendations for Outpatient Follow-up:  1. Follow up with PCP in 1-2 weeks 2. Please obtain BMP/CBC in one week Please follow up with cardiology for a CT coronary angiography.    Discharge Condition:stable. CODE STATUS:full code.  Diet recommendation: Heart Healthy   Brief/Interim Summary: 29 year old gentleman prior history of hypertension, hyperlipidemia, family history of CAD in his father at the age of 26 presents with substernal chest pain and hypertensive emergency. Was started on nitroglycerin gtt and admitted for evaluation of chest pain. Cardiology consulted to see if he need ischemic evaluation. Cardiology recommends changing antihypertensives to Norvasc and Coreg and continue with chlorthalidone.  Recommends outpatient CT angiography.  Discharge Diagnoses:  Principal Problem:   Hypertensive urgency Active Problems:   Hyperlipidemia   Chest pain   Podagra   Hypertensive emergency  Atypical chest pain:  Probably from hypertensive urgency.  Resolved after BP improved.  troponins have been negative.  EKG does not show any ischemic changes.  Cardiology consulted, recommended BP control and outpatient follow up with CT coronary angiography.    Hypertensive urgency:  He was started on nitro gtt and transitioned off with improvement in BP. Diltiazem discontinued, and he was started on coreg 6.25 mg BID, chlorthalidone, losartan and norvasc 10 mg daily.  BP parameters have improved.    Hyperlipidemia;  Resume statin.   Podagra:  Elevated uric acid levels.  Colchicine ordered.    Discharge Instructions  Discharge Instructions    Diet - low sodium heart healthy   Complete by: As directed    Discharge instructions   Complete by: As directed     Please follow up with cardiology for outpatient CT coronaries.     Allergies as of 07/06/2019   No Known Allergies     Medication List    STOP taking these medications   Cartia XT 180 MG 24 hr capsule Generic drug: diltiazem     TAKE these medications   amLODipine 10 MG tablet Commonly known as: NORVASC Take 1 tablet (10 mg total) by mouth daily. Start taking on: July 07, 2019   aspirin 81 MG EC tablet Take 1 tablet (81 mg total) by mouth daily. Start taking on: July 07, 2019   atorvastatin 20 MG tablet Commonly known as: LIPITOR Take 20 mg by mouth daily.   carvedilol 6.25 MG tablet Commonly known as: COREG Take 1 tablet (6.25 mg total) by mouth 2 (two) times daily with a meal.   chlorthalidone 25 MG tablet Commonly known as: HYGROTON Take 25 mg by mouth daily.   colchicine 0.6 MG tablet Take 1 tablet (0.6 mg total) by mouth 2 (two) times daily.   losartan 25 MG tablet Commonly known as: COZAAR Take 1 tablet (25 mg total) by mouth daily. Start taking on: July 07, 2019   MAGNESIUM PO Take 1 tablet by mouth daily.   VITAMIN D PO Take 1 tablet by mouth daily.      Follow-up Information    Soundra Pilon, FNP. Go on 07/17/2019.   Specialty: Family Medicine Why: @11 :15am Contact information: [place] [phone]        [phone], MD. Schedule an appointment as soon as possible for a visit in 1 week(s).   Specialty: Cardiology Contact information: [address]  [address] [place] [address] [phone]          No Known Allergies  Consultations:  cardiology   Procedures/Studies: DG Chest 2 View  Result Date: 07/04/2019 CLINICAL DATA:  Chest pain. Shortness of breath. Diaphoresis. The pain radiates into the left jaw. EXAM: CHEST - 2 VIEW COMPARISON:  None FINDINGS: The heart size and pulmonary vascularity are normal. Slight atelectasis at the right lung base, likely due to a shallow  inspiration. The lungs are otherwise clear. No effusions. No bone abnormality. IMPRESSION: Minimal atelectasis at the right lung base. Electronically Signed   By: Francene Boyers M.D.   On: 07/04/2019 19:48   CT Angio Chest PE W/Cm &/Or Wo Cm  Result Date: 07/04/2019 CLINICAL DATA:  29 year old male with shortness of breath. Concern for aortic dissection or pulmonary embolism. EXAM: CT ANGIOGRAPHY CHEST, ABDOMEN AND PELVIS TECHNIQUE: Multidetector CT imaging through the chest, abdomen and pelvis was performed using the standard protocol during bolus administration of intravenous contrast. Multiplanar reconstructed images and MIPs were obtained and reviewed to evaluate the vascular anatomy. CONTRAST:  OMNIPAQUE IOHEXOL 350 MG/ML SOLN COMPARISON:  Chest radiograph dated 07/04/2019. FINDINGS: CTA CHEST FINDINGS Cardiovascular: There is no cardiomegaly or pericardial effusion. The thoracic aorta is unremarkable. The origins of the great vessels of the aortic arch appear patent. There is common origin of the left common carotid artery and right brachiocephalic trunk. No CT evidence of pulmonary embolism. Mediastinum/Nodes: No hilar or mediastinal adenopathy. The esophagus is grossly unremarkable. No mediastinal fluid collection. Lungs/Pleura: There is eventration of the right hemidiaphragm. Bilateral diffuse hazy densities most consistent with atelectasis. Infiltrate is less likely. Clinical correlation is recommended. No focal consolidation, pleural effusion, or pneumothorax. The central airways are patent. Musculoskeletal: No chest wall abnormality. No acute or significant osseous findings. Review of the MIP images confirms the above findings. CTA ABDOMEN AND PELVIS FINDINGS VASCULAR Aorta: Normal caliber aorta without aneurysm, dissection, vasculitis or significant stenosis. Celiac: Patent without evidence of aneurysm, dissection, vasculitis or significant stenosis. SMA: Patent without evidence of aneurysm,  dissection, vasculitis or significant stenosis. Renals: Both renal arteries are patent without evidence of aneurysm, dissection, vasculitis, fibromuscular dysplasia or significant stenosis. IMA: Patent without evidence of aneurysm, dissection, vasculitis or significant stenosis. Inflow: Patent without evidence of aneurysm, dissection, vasculitis or significant stenosis. Veins: The SMV, splenic vein, and main portal vein are patent. No portal venous gas. The IVC at is unremarkable for the degree of opacification. Review of the MIP images confirms the above findings. NON-VASCULAR No intra-abdominal free air or free fluid. Hepatobiliary: Diffuse fatty infiltration of the liver. No intrahepatic biliary ductal dilatation. The gallbladder is unremarkable. Pancreas: Unremarkable. No pancreatic ductal dilatation or surrounding inflammatory changes. Spleen: Normal in size without focal abnormality. Adrenals/Urinary Tract: Adrenal glands are unremarkable. Kidneys are normal, without renal calculi, focal lesion, or hydronephrosis. Bladder is unremarkable. Stomach/Bowel: Stomach is within normal limits. Appendix appears normal. No evidence of bowel wall thickening, distention, or inflammatory changes. Lymphatic: No adenopathy. Reproductive: The prostate and seminal vesicles are grossly unremarkable. Other: None Musculoskeletal: No acute or significant osseous findings. Review of the MIP images confirms the above findings. IMPRESSION: 1. No acute intrathoracic, abdominal, or pelvic pathology. No pulmonary embolism or aortic dissection. 2. Fatty liver. Electronically Signed   By: Elgie Collard M.D.   On: 07/04/2019 21:33   CT Angio Chest/Abd/Pel for Dissection W and/or W/WO  Result Date: 07/04/2019 CLINICAL DATA:  29 year old male with shortness of breath. Concern for aortic dissection  or pulmonary embolism. EXAM: CT ANGIOGRAPHY CHEST, ABDOMEN AND PELVIS TECHNIQUE: Multidetector CT imaging through the chest, abdomen and  pelvis was performed using the standard protocol during bolus administration of intravenous contrast. Multiplanar reconstructed images and MIPs were obtained and reviewed to evaluate the vascular anatomy. CONTRAST:  OMNIPAQUE IOHEXOL 350 MG/ML SOLN COMPARISON:  Chest radiograph dated 07/04/2019. FINDINGS: CTA CHEST FINDINGS Cardiovascular: There is no cardiomegaly or pericardial effusion. The thoracic aorta is unremarkable. The origins of the great vessels of the aortic arch appear patent. There is common origin of the left common carotid artery and right brachiocephalic trunk. No CT evidence of pulmonary embolism. Mediastinum/Nodes: No hilar or mediastinal adenopathy. The esophagus is grossly unremarkable. No mediastinal fluid collection. Lungs/Pleura: There is eventration of the right hemidiaphragm. Bilateral diffuse hazy densities most consistent with atelectasis. Infiltrate is less likely. Clinical correlation is recommended. No focal consolidation, pleural effusion, or pneumothorax. The central airways are patent. Musculoskeletal: No chest wall abnormality. No acute or significant osseous findings. Review of the MIP images confirms the above findings. CTA ABDOMEN AND PELVIS FINDINGS VASCULAR Aorta: Normal caliber aorta without aneurysm, dissection, vasculitis or significant stenosis. Celiac: Patent without evidence of aneurysm, dissection, vasculitis or significant stenosis. SMA: Patent without evidence of aneurysm, dissection, vasculitis or significant stenosis. Renals: Both renal arteries are patent without evidence of aneurysm, dissection, vasculitis, fibromuscular dysplasia or significant stenosis. IMA: Patent without evidence of aneurysm, dissection, vasculitis or significant stenosis. Inflow: Patent without evidence of aneurysm, dissection, vasculitis or significant stenosis. Veins: The SMV, splenic vein, and main portal vein are patent. No portal venous gas. The IVC at is unremarkable for the  degree of opacification. Review of the MIP images confirms the above findings. NON-VASCULAR No intra-abdominal free air or free fluid. Hepatobiliary: Diffuse fatty infiltration of the liver. No intrahepatic biliary ductal dilatation. The gallbladder is unremarkable. Pancreas: Unremarkable. No pancreatic ductal dilatation or surrounding inflammatory changes. Spleen: Normal in size without focal abnormality. Adrenals/Urinary Tract: Adrenal glands are unremarkable. Kidneys are normal, without renal calculi, focal lesion, or hydronephrosis. Bladder is unremarkable. Stomach/Bowel: Stomach is within normal limits. Appendix appears normal. No evidence of bowel wall thickening, distention, or inflammatory changes. Lymphatic: No adenopathy. Reproductive: The prostate and seminal vesicles are grossly unremarkable. Other: None Musculoskeletal: No acute or significant osseous findings. Review of the MIP images confirms the above findings. IMPRESSION: 1. No acute intrathoracic, abdominal, or pelvic pathology. No pulmonary embolism or aortic dissection. 2. Fatty liver. Electronically Signed   By: Elgie Collard M.D.   On: 07/04/2019 21:33       Subjective: No new complaints.   Discharge Exam: Vitals:   07/06/19 1100 07/06/19 1407  BP: (!) 153/112 (!) 147/84  Pulse: 85 93  Resp: 18   Temp: 98 F (36.7 C)   SpO2: 98%     General: Pt is alert, awake, not in acute distress Cardiovascular: RRR, S1/S2 +, no rubs, no gallops Respiratory: CTA bilaterally, no wheezing, no rhonchi Abdominal: Soft, NT, ND, bowel sounds + Extremities: no edema, no cyanosis    The results of significant diagnostics from this hospitalization (including imaging, microbiology, ancillary and laboratory) are listed below for reference.     Microbiology: Recent Results (from the past 240 hour(s))  SARS CORONAVIRUS 2 (TAT 6-24 HRS) Nasopharyngeal Nasopharyngeal Swab     Status: None   Collection Time: 07/04/19 11:29 PM    Specimen: Nasopharyngeal Swab  Result Value Ref Range Status   SARS Coronavirus 2 NEGATIVE NEGATIVE Final  Comment: (NOTE) SARS-CoV-2 target nucleic acids are NOT DETECTED. The SARS-CoV-2 RNA is generally detectable in upper and lower respiratory specimens during the acute phase of infection. Negative results do not preclude SARS-CoV-2 infection, do not rule out co-infections with other pathogens, and should not be used as the sole basis for treatment or other patient management decisions. Negative results must be combined with clinical observations, patient history, and epidemiological information. The expected result is Negative. Fact Sheet for Patients: SugarRoll.be Fact Sheet for Healthcare Providers: https://www.woods-mathews.com/ This test is not yet approved or cleared by the [place] FDA and  has been authorized for detection and/or diagnosis of SARS-CoV-2 by FDA under an Emergency Use Authorization (EUA). This EUA will remain  in effect (meaning this test can be used) for the duration of the COVID-19 declaration under Section 56 4(b)(1) of the Act, 21 U.S.C. section 360bbb-3(b)(1), unless the authorization is terminated or revoked sooner. Performed at [facility], [place] [address], [place], [place] [address]      Labs: BNP (last 3 results) No results for input(s): BNP in the last 8760 hours. Basic Metabolic Panel: Recent Labs  Lab 07/04/19 1711 07/05/19 0037 07/05/19 0528 07/06/19 0437  NA 136  --  139 139  K 3.4*  --  3.6 3.5  CL 99  --  102 100  CO2 22  --  24 28  GLUCOSE 177*  --  117* 123*  BUN 11  --  11 9  CREATININE 0.89 0.91 0.92 1.02  CALCIUM 9.2  --  9.1 9.0   Liver Function Tests: Recent Labs  Lab 07/05/19 0528  AST 61*  ALT 87*  ALKPHOS 83  BILITOT 1.0  PROT 6.2*  ALBUMIN 3.4*   No results for input(s): LIPASE, AMYLASE in the last 168 hours. No results for input(s): AMMONIA in the last  168 hours. CBC: Recent Labs  Lab 07/04/19 1711 07/05/19 0037 07/05/19 0528 07/06/19 0437  WBC 9.1 7.3 7.3 8.4  NEUTROABS  --   --  3.2  --   HGB 16.2 14.6 14.9 14.7  HCT 45.6 41.9 42.9 42.6  MCV 86.5 87.3 87.6 87.5  PLT 278 220 215 226   Cardiac Enzymes: No results for input(s): CKTOTAL, CKMB, CKMBINDEX, TROPONINI in the last 168 hours. BNP: Invalid input(s): POCBNP CBG: No results for input(s): GLUCAP in the last 168 hours. D-Dimer No results for input(s): DDIMER in the last 72 hours. Hgb A1c Recent Labs    07/05/19 0522  HGBA1C 6.1*   Lipid Profile Recent Labs    07/05/19 0528  CHOL 158  HDL 26*  LDLCALC UNABLE TO CALCULATE IF TRIGLYCERIDE OVER 400 mg/dL  TRIG 498*  CHOLHDL 6.1  LDLDIRECT 64.2   Thyroid function studies No results for input(s): TSH, T4TOTAL, T3FREE, THYROIDAB in the last 72 hours.  Invalid input(s): FREET3 Anemia work up No results for input(s): VITAMINB12, FOLATE, FERRITIN, TIBC, IRON, RETICCTPCT in the last 72 hours. Urinalysis No results found for: COLORURINE, APPEARANCEUR, [place], [place], [place], [place], [place], [place], PROTEINUR, UROBILINOGEN, NITRITE, LEUKOCYTESUR Sepsis Labs Invalid input(s): PROCALCITONIN,  WBC,  LACTICIDVEN Microbiology Recent Results (from the past 240 hour(s))  SARS CORONAVIRUS 2 (TAT 6-24 HRS) Nasopharyngeal Nasopharyngeal Swab     Status: None   Collection Time: 07/04/19 11:29 PM   Specimen: Nasopharyngeal Swab  Result Value Ref Range Status   SARS Coronavirus 2 NEGATIVE NEGATIVE Final    Comment: (NOTE) SARS-CoV-2 target nucleic acids are NOT DETECTED. The SARS-CoV-2 RNA is generally  detectable in upper and lower respiratory specimens during the acute phase of infection. Negative results do not preclude SARS-CoV-2 infection, do not rule out co-infections with other pathogens, and should not be used as the sole basis for treatment or other patient management decisions. Negative results must be  combined with clinical observations, patient history, and epidemiological information. The expected result is Negative. Fact Sheet for Patients: HairSlick.no Fact Sheet for Healthcare Providers: quierodirigir.com This test is not yet approved or cleared by the [place] FDA and  has been authorized for detection and/or diagnosis of SARS-CoV-2 by FDA under an Emergency Use Authorization (EUA). This EUA will remain  in effect (meaning this test can be used) for the duration of the COVID-19 declaration under Section 56 4(b)(1) of the Act, 21 U.S.C. section 360bbb-3(b)(1), unless the authorization is terminated or revoked sooner. Performed at [facility], [address] [place], [place] [address]      Time coordinating discharge: 34 min  SIGNED:   Kathlen Mody, MD  [facility] 07/06/2019, 7:28 PM

## 2019-07-06 NOTE — Progress Notes (Addendum)
Discharge education and medication education given to patient with teach back. Education on low sodium heart healthy diet, and when to call MD given. All questions and concerns answered. Peripheral IV and telemetry leads removed. All patient belongings given to patient. Patient transported to main entrance by nurse.

## 2019-07-07 ENCOUNTER — Other Ambulatory Visit: Payer: Self-pay | Admitting: *Deleted

## 2019-07-07 MED FILL — ATORVASTATIN 20 MG TABLET: 20 | 90 days supply | Qty: 90 | Fill #2

## 2019-07-07 NOTE — Patient Outreach (Signed)
[facility]) Care Management  07/07/2019  Ryan Gay May 29, 1990 [id number]   Transition of care telephone call  Referral received:07/06/19 Initial outreach:07/07/19 Insurance: [name]   Initial unsuccessful telephone call to patient's preferred number in order to complete transition of care assessment; no answer, left HIPAA compliant voicemail message requesting return call.   Objective: Per the electronic medical record, Ryan Gay   was hospitalized at [facility] from 2/24-2/25 for Hypertensive urgency . Comorbidities include: Hypertension ,hyperlipidemia  He/She was discharged to home on 07/06/19 without the need for home health services or durable medical equipment per the discharge summary.   Plan: This RNCM will route unsuccessful outreach letter with [facility] Care Management pamphlet and 24 hour Nurse Advice Line Magnet to [name] Care Management clinical pool to be mailed to patient's home address. This RNCM will attempt another outreach within 4 business days.   Egbert Garibaldi, RN, BSN  [facility] Care Management,Care Management Coordinator  [phone]- Mobile [phone]- Toll Free Main Office

## 2019-07-12 ENCOUNTER — Other Ambulatory Visit: Payer: Self-pay | Admitting: *Deleted

## 2019-07-12 ENCOUNTER — Encounter: Payer: Self-pay | Admitting: *Deleted

## 2019-07-12 NOTE — Patient Outreach (Signed)
[facility]) Care Management  07/12/2019  Ryan Gay Sep 22, 1990 [id number]   Transition of care call/case closure   Referral received:07/06/19 Initial outreach: 07/07/19 Insurance: [employer]    Subjective: 2nd outreach , successful telephone call to patient's preferred number in order to complete transition of care assessment; 2 HIPAA identifiers verified. Explained purpose of call and completed transition of care assessment.  Ryan Gay reports that he is doing much better, he denies chest pain shortness of breath .He reports improvement in blood pressure readings, ranging in the 140's/90. He reports improvement in pain in left great toe. He denies any need for further education material  He states that his wife  is a nurse is  available to assist as needed .   Discussed chronic health condition of hypertension , discussed [place] chronic disease management programs, he states that he is familiar with, that have the [employer] management app, he will review and considers enrolling in a program.   He states that they  have the hospital indemnity and wife is working on submitting claim  He uses a Insurance risk [facility] outpatient pharmacy at [place] [facility] .    Objective:  Ryan Gay   was hospitalized at [facility] from 2/24-2/25 for Hypertensive urgency, Left great toe pain.  Comorbidities include: Hypertension ,hyperlipidemia  He was discharged to home on 07/06/19 without the need for home health services or durable medical equipment per the discharge summary.  Assessment:  Patient voices good understanding of all discharge instructions.  See transition of care flowsheet for assessment details.   Plan:  Reviewed hospital discharge diagnosis of hypertensive urgency  and discharge treatment plan using hospital discharge instructions, assessing medication adherence, reviewing problems requiring provider notification, and discussing the importance of follow up with  primary care provider  and/or specialists as directed. Reviewed [place] healthy lifestyle program information to receive discounted premium for  2022  Step 1: Get annual physical between May 11, 2018 and November 09, 2019; Step 2: Complete your health assessment between May 12, 2019 and January 10, 2020 at [email or web address] Step 3:Identify your current health status and complete the corresponding action step between January 1, and January 10, 2020.    No ongoing care management needs identified so will close case to [facility] Care Management services and patient reports that he has received outreach letter packet to home on today.  Ryan Garibaldi, RN, BSN  [facility] Care Management,Care Management Coordinator  [phone]- Mobile [phone]- Toll Free Main Office

## 2019-07-13 ENCOUNTER — Ambulatory Visit: Payer: No Typology Code available for payment source | Admitting: Internal Medicine

## 2019-07-18 NOTE — Progress Notes (Signed)
Cardiology Office Note:   Date:  07/19/2019  NAME:  Ryan Gay    MRN: [id number] DOB:  1990-05-19   PCP:  Soundra Pilon, FNP  Cardiologist:  No primary care provider on file.   Referring MD: Soundra Pilon, FNP   Chief Complaint  Patient presents with  . Abnormal ECG   History of Present Illness:   Ryan Gay is a 29 y.o. male with a hx of OSA, HTN, obesity who is being seen today for the evaluation of abnormal EKG at the request of Soundra Pilon, FNP. Admitted to the hospital 2/23-2/25 for HTN emergency and chest pain. BP controlled and sent home. HS-troponin were negative x 2. EKG with LVH.  He reports 1 day before admission to the hospital on July 03, 2019 he developed left-sided chest pain.  Is described as squeezing pressure.  The pain was alleviated with nitroglycerin.  He was admitted to the hospital several systolic blood pressures in the 220 range.  He was briefly placed on a nitro drip with resolution of chest pain as well as improvement in blood pressure.  He reports since leaving the hospital has had no further episodes of chest pain.  He does not exercise routinely but does have 2 small children who keep him plenty busy.  He reports no chest pain or shortness of breath with exercise.  His current blood pressure medications include carvedilol, chlorthalidone, losartan, amlodipine.  His blood pressure today is 150/92.  He reports at home is been in the 130 range.  He is a current smoker.  He used tobacco products until a number of years ago and is switched to vaping.  He is trying to quit this.  He does report some excess alcohol use over the past few months.  He does report significant stress in life with children and get around town.  He does report that he is gained 60 pounds over the past few months.  He reports he is working on diet and trying intermittent fasting.  He is working on cutting carbs and losing weight.  Review of his most recent lipid profile shows a total  cholesterol 158, HDL 26, direct LDL 64, triglycerides 498.  Most recent A1c was 6.1.  Serum creatinine was normal.  Serum thyroid studies 2.35.  He does report that his father has had 2 bypass surgeries and several heart attacks.  He is quite concerned.  Regarding his medical history he also has a longstanding history of hypertension.  He reports since the age of 48 has been told he is at high blood pressure.  He was evaluated at the [facility] for this.  He was evaluated by kidney specialist with no definitive etiology found.  Other CVD risk factors include central sleep apnea.  He does use his machine.  Problem List 1. HTN 2. Obesity 3. OSA 4.  Hyperlipidemia -Total cholesterol 158, HDL 26, LDL 64, triglycerides 498  Past Medical History: Past Medical History:  Diagnosis Date  . Anxiety   . Depression   . High cholesterol   . Hypertension   . Seizures (HCC)     Past Surgical History: Past Surgical History:  Procedure Laterality Date  . VASECTOMY    . WISDOM TOOTH EXTRACTION      Current Medications: Current Meds  Medication Sig  . amLODipine (NORVASC) 10 MG tablet Take 1 tablet (10 mg total) by mouth daily.  Marland Kitchen aspirin EC 81 MG EC tablet Take 1 tablet (  81 mg total) by mouth daily.  . carvedilol (COREG) 25 MG tablet Take 1 tablet (25 mg total) by mouth 2 (two) times daily with a meal.  . chlorthalidone (HYGROTON) 25 MG tablet Take 25 mg by mouth daily.   . colchicine 0.6 MG tablet Take 1 tablet (0.6 mg total) by mouth 2 (two) times daily.  Marland Kitchen losartan (COZAAR) 25 MG tablet Take 1 tablet (25 mg total) by mouth daily.  Marland Kitchen MAGNESIUM PO Take 1 tablet by mouth daily.  Marland Kitchen VITAMIN D PO Take 1 tablet by mouth daily.  . [DISCONTINUED] atorvastatin (LIPITOR) 20 MG tablet Take 20 mg by mouth daily.  . [DISCONTINUED] carvedilol (COREG) 6.25 MG tablet Take 1 tablet (6.25 mg total) by mouth 2 (two) times daily with a meal.     Allergies:    Patient has no known  allergies.   Social History: Social History   Socioeconomic History  . Marital status: Married    Spouse name: Not on file  . Number of children: 2  . Years of education: Not on file  . Highest education level: Not on file  Occupational History  . Occupation: [employer]  Tobacco Use  . Smoking status: Former Smoker    Years: 12.00    Quit date: 10/03/2016    Years since quitting: 2.7  . Smokeless tobacco: Never Used  Substance and Sexual Activity  . Alcohol use: Yes    Comment: 4 drinks per wk  . Drug use: No  . Sexual activity: Yes    Partners: Female  Other Topics Concern  . Not on file  Social History Narrative   Lives at home w/ wife and kids   Right-handed   Caffeine: 1 Red Bull daily   Social Determinants of Health   Financial Resource Strain:   . Difficulty of Paying Living Expenses: Not on file  Food Insecurity:   . Worried About Programme researcher, broadcasting/film/video in the Last Year: Not on file  . Ran Out of Food in the Last Year: Not on file  Transportation Needs:   . Lack of Transportation (Medical): Not on file  . Lack of Transportation (Non-Medical): Not on file  Physical Activity:   . Days of Exercise per Week: Not on file  . Minutes of Exercise per Session: Not on file  Stress:   . Feeling of Stress : Not on file  Social Connections:   . Frequency of Communication with Friends and Family: Not on file  . Frequency of Social Gatherings with Friends and Family: Not on file  . Attends Religious Services: Not on file  . Active Member of Clubs or Organizations: Not on file  . Attends Banker Meetings: Not on file  . Marital Status: Not on file     Family History: The patient's family history includes Heart disease in his father; Hypertension in his father and mother; Hypothyroidism in his mother; Liver cancer in his maternal grandfather.  ROS:   All other ROS reviewed and negative. Pertinent positives noted in the HPI.     EKGs/Labs/Other  Studies Reviewed:   The following studies were personally reviewed by me today:  EKG:  EKG is ordered today.  The ekg ordered today demonstrates normal sinus rhythm, heart rate 88, no acute ST-T changes, no evidence of prior infarction, and was personally reviewed by me.   Recent Labs: 07/05/2019: ALT 87 07/06/2019: BUN 9; Creatinine, Ser 1.02; Hemoglobin 14.7; Platelets 226; Potassium 3.5; Sodium 139  Recent Lipid Panel    Component Value Date/Time   CHOL 158 07/05/2019 0528   TRIG 498 (H) 07/05/2019 0528   HDL 26 (L) 07/05/2019 0528   CHOLHDL 6.1 07/05/2019 0528   VLDL UNABLE TO CALCULATE IF TRIGLYCERIDE OVER 400 mg/dL 16/10/960402/24/2021 [id number]   LDLCALC UNABLE TO CALCULATE IF TRIGLYCERIDE OVER 400 mg/dL 81/19/147802/24/2021 [id number]   LDLDIRECT 64.2 07/05/2019 0528    Physical Exam:   VS:  BP (!) 150/92   Pulse 88   Ht 6\' 2"  (1.88 m)   Wt (!) 303 lb 3.2 oz (137.5 kg)   SpO2 98%   BMI 38.93 kg/m    Wt Readings from Last 3 Encounters:  07/19/19 (!) 303 lb 3.2 oz (137.5 kg)  07/06/19 (!) 303 lb 8 oz (137.7 kg)  06/02/17 249 lb 12.8 oz (113.3 kg)    General: Well nourished, well developed, in no acute distress Heart: Atraumatic, normal size  Eyes: PEERLA, EOMI  Neck: Supple, no JVD Endocrine: No thryomegaly Cardiac: Normal S1, S2; RRR; no murmurs, rubs, or gallops Lungs: Clear to auscultation bilaterally, no wheezing, rhonchi or rales  Abd: Soft, nontender, no hepatomegaly  Ext: No edema, pulses 2+ Musculoskeletal: No deformities, BUE and BLE strength normal and equal Skin: Warm and dry, no rashes   Neuro: Alert and oriented to person, place, time, and situation, CNII-XII grossly intact, no focal deficits  Psych: Normal mood and affect   ASSESSMENT:   Ryan Gay is a 29 y.o. male who presents for the following: 1. Nonspecific abnormal electrocardiogram (ECG) (EKG)   2. Essential hypertension   3. Mixed hyperlipidemia   4. Chest pain, unspecified type    PLAN:   1. Chest pain,  unspecified type -Recent admission to the hospital with chest pain and hypertensive crisis.  No further chest pain with blood pressure control.  Given longstanding history of hypertension since the age of 29 and hyperlipidemia, I think it is prudent to proceed with coronary CTA.  We will also be able to further restratify him with a coronary calcium score on that study.  He will take 25 mg of Coreg 2 hours before the scan.  I am increasing this medication dose today.  I will also increase his Lipitor to 40 mg daily.  We will obtain a repeat lipid profile in 3 months.  He has been maintained on aspirin therapy as well.  We also will obtain an echocardiogram given his long history of hypertension.  2. Nonspecific abnormal electrocardiogram (ECG) (EKG) 3. Essential hypertension -Longstanding history of hypertension.  He reports since the age of 89.  Blood pressure was severely elevated in the emergency room.  We will increase his Coreg to 25 mg twice daily.  He will take it 2 hours before the scan as instructed above for his cardiac CTA.  We will continue his amlodipine 10 mg, chlorthalidone 25 mg daily, losartan 25 mg daily. -[facility].  No etiology was found.  He does have several risk factors for hypertension including OSA, obesity, and recent increased alcohol intake.  He reports he uses CPAP machine and has cut back on alcohol use.  He will continue to do this moving forward.  When I see him back in 3 months we will discuss if any further work-up is needed.  Overall I feel this is essential hypertension in the setting of multiple risk factors.  4. Mixed hyperlipidemia -Main issue is hypertriglyceridemia.  Recent value 07/05/2019 was 498.  His direct LDL was 64.  We will continue statin for now.  He is also had increased alcohol use which he is cut back on.  We will plan to increase his Lipitor to 40 mg daily and then recheck a lipid  profile 1 week before he sees me back in 3 months.  Disposition: Return in about 3 months (around 10/19/2019).  Medication Adjustments/Labs and Tests Ordered: Current medicines are reviewed at length with the patient today.  Concerns regarding medicines are outlined above.  Orders Placed This Encounter  Procedures  . CT CORONARY MORPH W/CTA COR W/SCORE W/CA W/CM &/OR WO/CM  . CT CORONARY FRACTIONAL FLOW RESERVE DATA PREP  . CT CORONARY FRACTIONAL FLOW RESERVE FLUID ANALYSIS  . Lipid panel  . LDL cholesterol, direct  . Basic metabolic panel  . EKG 12-Lead  . ECHOCARDIOGRAM COMPLETE   Meds ordered this encounter  Medications  . atorvastatin (LIPITOR) 40 MG tablet    Sig: Take 1 tablet (40 mg total) by mouth daily.    Dispense:  90 tablet    Refill:  3  . carvedilol (COREG) 25 MG tablet    Sig: Take 1 tablet (25 mg total) by mouth 2 (two) times daily with a meal.    Dispense:  90 tablet    Refill:  1    Patient Instructions  Medication Instructions:  Increase Carvedilol 25 mg twice daily Increase Lipitor 40 mg daily   *If you need a refill on your cardiac medications before your next appointment, please call your pharmacy*   Lab Work: BMET one week before CT when scheduled.   Lipid panel, direct LDL one week before follow up appointment. (come fasting, no appointment needed) If you have labs (blood work) drawn today and your tests are completely normal, you will receive your results only by: Marland Kitchen MyChart Message (if you have MyChart) OR . A paper copy in the mail If you have any lab test that is abnormal or we need to change your treatment, we will call you to review the results.   Testing/Procedures: Your physician has requested that you have cardiac CT. Cardiac computed tomography (CT) is a painless test that uses an x-ray machine to take clear, detailed pictures of your heart. For further information please visit [email or web address]. Please follow instruction sheet as  given.   Echocardiogram - Your physician has requested that you have an echocardiogram. Echocardiography is a painless test that uses sound waves to create images of your heart. It provides your doctor with information about the size and shape of your heart and how well your heart's chambers and valves are working. This procedure takes approximately one hour. There are no restrictions for this procedure. This will be performed at our [facility] location - [address], [address].    Follow-Up: At [facility], you and your health needs are our priority.  As part of our continuing mission to provide you with exceptional heart care, we have created designated Provider Care Teams.  These Care Teams include your primary Cardiologist (physician) and Advanced Practice Providers (APPs -  Physician Assistants and Nurse Practitioners) who all work together to provide you with the care you need, when you need it.  We recommend signing up for the patient portal called "MyChart".  Sign up information is provided on this After Visit Summary.  MyChart is used to connect with patients for Virtual Visits (Telemedicine).  Patients are able to view lab/test  results, encounter notes, upcoming appointments, etc.  Non-urgent messages can be sent to your provider as well.   To learn more about what you can do with MyChart, go to [email or web address].    Your next appointment:   3 month(s)  The format for your next appointment:   In Person  Provider:   Lennie Odor, MD   Other Instructions  Your cardiac CT will be scheduled at one of the below locations[facility] [address] [place], [place] [address] [phone]   If scheduled at [facility], please arrive at the [facility] main entrance of [facility] 30 minutes prior to test start time. Proceed to the [facility] Radiology Department (first floor) to check-in and test prep.   Please follow these  instructions carefully (unless otherwise directed):  Hold all erectile dysfunction medications at least 3 days (72 hrs) prior to test.  On the Night Before the Test: . Be sure to Drink plenty of water. . Do not consume any caffeinated/decaffeinated beverages or chocolate 12 hours prior to your test. . Do not take any antihistamines 12 hours prior to your test. . If you take Metformin do not take 24 hours prior to test. . If the patient has contrast allergy:  On the Day of the Test: . Drink plenty of water. Do not drink any water within one hour of the test. . Do not eat any food 4 hours prior to the test. . You may take your regular medications prior to the test.  . Take Carvedilol tablet two hours prior to test. . HOLD Furosemide/Hydrochlorothiazide morning of the test. . FEMALES- please wear underwire-free bra if available       After the Test: . Drink plenty of water. . After receiving IV contrast, you may experience a mild flushed feeling. This is normal. . On occasion, you may experience a mild rash up to 24 hours after the test. This is not dangerous. If this occurs, you can take Benadryl 25 mg and increase your fluid intake. . If you experience trouble breathing, this can be serious. If it is severe call 911 IMMEDIATELY. If it is mild, please call our office. . If you take any of these medications: Glipizide/Metformin, Avandament, Glucavance, please do not take 48 hours after completing test unless otherwise instructed.   Once we have confirmed authorization from your insurance company, we will call you to set up a date and time for your test.   For non-scheduling related questions, please contact the cardiac imaging nurse navigator should you have any questions/concerns: Rockwell Alexandria, RN Navigator Cardiac Imaging [facility] Heart and Vascular Services [phone] office       Signed, Lenna Gilford. Flora Lipps, MD [facility]  [address], [address] [place], [place] [address] [phone]  07/19/2019 2:49 PM

## 2019-07-19 ENCOUNTER — Ambulatory Visit (INDEPENDENT_AMBULATORY_CARE_PROVIDER_SITE_OTHER): Payer: No Typology Code available for payment source | Admitting: Cardiovascular Disease

## 2019-07-19 ENCOUNTER — Encounter: Payer: Self-pay | Admitting: Cardiovascular Disease

## 2019-07-19 ENCOUNTER — Other Ambulatory Visit: Payer: Self-pay

## 2019-07-19 VITALS — BP 150/92 | HR 88 | Ht 74.0 in | Wt 303.2 lb

## 2019-07-19 DIAGNOSIS — R079 Chest pain, unspecified: Secondary | ICD-10-CM | POA: Diagnosis not present

## 2019-07-19 DIAGNOSIS — E782 Mixed hyperlipidemia: Secondary | ICD-10-CM | POA: Diagnosis not present

## 2019-07-19 DIAGNOSIS — I10 Essential (primary) hypertension: Secondary | ICD-10-CM

## 2019-07-19 DIAGNOSIS — R9431 Abnormal electrocardiogram [ECG] [EKG]: Secondary | ICD-10-CM | POA: Diagnosis not present

## 2019-07-19 MED ORDER — ATORVASTATIN CALCIUM 40 MG PO TABS: 40.0000 mg | ORAL_TABLET | Freq: Every day | ORAL | 3 refills | Status: DC

## 2019-07-19 MED ORDER — CARVEDILOL 25 MG PO TABS: 25.0000 mg | ORAL_TABLET | Freq: Two times a day (BID) | ORAL | 1 refills | Status: DC

## 2019-07-19 MED FILL — ATORVASTATIN 40 MG TABLET: 40 | 90 days supply | Qty: 90 | Fill #0

## 2019-07-19 MED FILL — CARVEDILOL 25 MG TABLET: 25 | 90 days supply | Qty: 180 | Fill #0

## 2019-07-19 NOTE — Patient Instructions (Addendum)
Medication Instructions:  Increase Carvedilol 25 mg twice daily Increase Lipitor 40 mg daily   *If you need a refill on your cardiac medications before your next appointment, please call your pharmacy*   Lab Work: BMET one week before CT when scheduled.   Lipid panel, direct LDL one week before follow up appointment. (come fasting, no appointment needed) If you have labs (blood work) drawn today and your tests are completely normal, you will receive your results only by: Marland Kitchen MyChart Message (if you have MyChart) OR . A paper copy in the mail If you have any lab test that is abnormal or we need to change your treatment, we will call you to review the results.   Testing/Procedures: Your physician has requested that you have cardiac CT. Cardiac computed tomography (CT) is a painless test that uses an x-ray machine to take clear, detailed pictures of your heart. For further information please visit [email or web address] Please follow instruction sheet as given.   Echocardiogram - Your physician has requested that you have an echocardiogram. Echocardiography is a painless test that uses sound waves to create images of your heart. It provides your doctor with information about the size and shape of your heart and how well your heart's chambers and valves are working. This procedure takes approximately one hour. There are no restrictions for this procedure. This will be performed at our [facility] location - [address], [address].    Follow-Up: At [facility], you and your health needs are our priority.  As part of our continuing mission to provide you with exceptional heart care, we have created designated Provider Care Teams.  These Care Teams include your primary Cardiologist (physician) and Advanced Practice Providers (APPs -  Physician Assistants and Nurse Practitioners) who all work together to provide you with the care you need, when you need it.  We recommend signing up for the  patient portal called "MyChart".  Sign up information is provided on this After Visit Summary.  MyChart is used to connect with patients for Virtual Visits (Telemedicine).  Patients are able to view lab/test results, encounter notes, upcoming appointments, etc.  Non-urgent messages can be sent to your provider as well.   To learn more about what you can do with MyChart, go to [email or web address].    Your next appointment:   3 month(s)  The format for your next appointment:   In Person  Provider:   Lennie Odor, MD   Other Instructions  Your cardiac CT will be scheduled at one of the below locations:   [facility] [address] [place], [place] [address] [phone]   If scheduled at [facility], please arrive at the [facility] main entrance of [facility] 30 minutes prior to test start time. Proceed to the [facility] Radiology Department (first floor) to check-in and test prep.   Please follow these instructions carefully (unless otherwise directed):  Hold all erectile dysfunction medications at least 3 days (72 hrs) prior to test.  On the Night Before the Test: . Be sure to Drink plenty of water. . Do not consume any caffeinated/decaffeinated beverages or chocolate 12 hours prior to your test. . Do not take any antihistamines 12 hours prior to your test. . If you take Metformin do not take 24 hours prior to test. . If the patient has contrast allergy:  On the Day of the Test: . Drink plenty of water. Do not drink any water within one  hour of the test. . Do not eat any food 4 hours prior to the test. . You may take your regular medications prior to the test.  . Take Carvedilol tablet two hours prior to test. . HOLD Furosemide/Hydrochlorothiazide morning of the test. . FEMALES- please wear underwire-free bra if available       After the Test: . Drink plenty of water. . After receiving IV contrast, you may experience a mild flushed  feeling. This is normal. . On occasion, you may experience a mild rash up to 24 hours after the test. This is not dangerous. If this occurs, you can take Benadryl 25 mg and increase your fluid intake. . If you experience trouble breathing, this can be serious. If it is severe call 911 IMMEDIATELY. If it is mild, please call our office. . If you take any of these medications: Glipizide/Metformin, Avandament, Glucavance, please do not take 48 hours after completing test unless otherwise instructed.   Once we have confirmed authorization from your insurance company, we will call you to set up a date and time for your test.   For non-scheduling related questions, please contact the cardiac imaging nurse navigator should you have any questions/concerns: Marchia Bond, RN Navigator Cardiac Imaging [facility] Heart and Vascular Services [phone] office

## 2019-08-03 ENCOUNTER — Ambulatory Visit: Payer: No Typology Code available for payment source | Admitting: Internal Medicine

## 2019-08-04 MED FILL — LOSARTAN POTASSIUM 25 MG TA: 25 | 30 days supply | Qty: 30 | Fill #1

## 2019-08-04 MED FILL — ASPIRIN 81 MG TBEC: 81 | 30 days supply | Qty: 30 | Fill #1

## 2019-08-04 MED FILL — AMLODIPINE BESYLATE 10 MG T: 10 | 30 days supply | Qty: 30 | Fill #1

## 2019-08-10 ENCOUNTER — Ambulatory Visit (HOSPITAL_COMMUNITY): Payer: No Typology Code available for payment source | Attending: Internal Medicine

## 2019-08-10 ENCOUNTER — Other Ambulatory Visit: Payer: Self-pay

## 2019-08-10 DIAGNOSIS — R079 Chest pain, unspecified: Secondary | ICD-10-CM | POA: Diagnosis not present

## 2019-08-10 MED ORDER — PERFLUTREN LIPID MICROSPHERE
1.0000 mL | INTRAVENOUS | Status: AC | PRN
  Administered 2019-08-10: 1 mL via INTRAVENOUS

## 2019-08-21 LAB — BASIC METABOLIC PANEL
BUN/Creatinine Ratio: 17 (ref 9–20)
BUN: 12 mg/dL (ref 6–20)
CO2: 22 mmol/L (ref 20–29)
Calcium: 9.7 mg/dL (ref 8.7–10.2)
Chloride: 99 mmol/L (ref 96–106)
Creatinine, Ser: 0.72 mg/dL — ABNORMAL LOW (ref 0.76–1.27)
GFR calc Af Amer: 145 mL/min/{1.73_m2} (ref >59)
GFR calc non Af Amer: 125 mL/min/{1.73_m2} (ref >59)
Glucose: 113 mg/dL — ABNORMAL HIGH (ref 65–99)
Potassium: 3.7 mmol/L (ref 3.5–5.2)
Sodium: 140 mmol/L (ref 134–144)

## 2019-08-23 ENCOUNTER — Encounter (HOSPITAL_COMMUNITY): Payer: Self-pay

## 2019-08-24 ENCOUNTER — Ambulatory Visit (HOSPITAL_COMMUNITY): Payer: No Typology Code available for payment source

## 2019-09-01 ENCOUNTER — Telehealth: Payer: Self-pay | Admitting: Cardiovascular Disease

## 2019-09-01 NOTE — Telephone Encounter (Signed)
New Message   *STAT* If patient is at the pharmacy, call can be transferred to refill team.   1. Which medications need to be refilled? (please list name of each medication and dose if known) losartan (COZAAR) 25 MG tablet, colchicine 0.6 MG tablet, aspirin EC 81 MG EC tablet, amLODipine (NORVASC) 10 MG tablet  2. Which pharmacy/location (including street and city if local pharmacy) is medication to be sent to? [facility] [place], [place] - [address] [place]  3. Do they need a 30 day or 90 day supply? 90 day supply for all

## 2019-09-04 MED ORDER — LOSARTAN POTASSIUM 25 MG PO TABS: 25.0000 mg | ORAL_TABLET | Freq: Every day | ORAL | 1 refills | Status: DC

## 2019-09-04 MED FILL — LOSARTAN POTASSIUM 25 MG TA: 25 | 90 days supply | Qty: 90 | Fill #0

## 2019-09-04 NOTE — Telephone Encounter (Signed)
Rx request sent to pharmacy.  

## 2019-09-05 MED FILL — CHLORTHALIDONE 25 MG TABS: 25 | 90 days supply | Qty: 90 | Fill #2

## 2019-09-06 ENCOUNTER — Telehealth (HOSPITAL_COMMUNITY): Payer: Self-pay | Admitting: Emergency Medicine

## 2019-09-06 ENCOUNTER — Other Ambulatory Visit: Payer: Self-pay | Admitting: Cardiovascular Disease

## 2019-09-06 NOTE — Telephone Encounter (Signed)
  *  STAT* If patient is at the pharmacy, call can be transferred to refill team.   1. Which medications need to be refilled? (please list name of each medication and dose if known)   amLODipine (NORVASC) 10 MG tablet    aspirin EC 81 MG EC tablet     2. Which pharmacy/location (including street and city if local pharmacy) is medication to be sent to? [facility] - [place], [place] - [address] [place]  3. Do they need a 30 day or 90 day supply? 30 days

## 2019-09-06 NOTE — Telephone Encounter (Signed)
Reaching out to patient to offer assistance regarding upcoming cardiac imaging study; pt verbalizes understanding of appt date/time, parking situation and where to check in, pre-test NPO status and medications ordered, and verified current allergies; name and call back number provided for further questions should they arise Cleotha Whalin RN Navigator Cardiac Imaging [place] [facility] [phone] office [phone] cell 

## 2019-09-07 ENCOUNTER — Other Ambulatory Visit: Payer: Self-pay

## 2019-09-07 ENCOUNTER — Ambulatory Visit (HOSPITAL_COMMUNITY)
Admission: RE | Admit: 2019-09-07 | Discharge: 2019-09-07 | Disposition: A | Discharge status: Home / Self Care | Payer: No Typology Code available for payment source | Source: Ambulatory Visit | Attending: Cardiovascular Disease | Admitting: Cardiovascular Disease

## 2019-09-07 DIAGNOSIS — R079 Chest pain, unspecified: Secondary | ICD-10-CM | POA: Insufficient documentation

## 2019-09-07 IMAGING — CT CT HEART MORP W/ CTA COR W/ SCORE W/ CA W/CM &/OR W/O CM
4 of 7 series · 8 of 20 positions shown, 9 images · IV contrast (APPLIED)
Comparison: 07/04/2019
COMPARISON: 07/04/2019

Addendum:
EXAM:
OVER-READ INTERPRETATION  CT CHEST

The following report is an over-read performed by radiologist Dr.
Nasana Rafiu [[email or web address]] on 09/07/2019. This over-read
does not include interpretation of cardiac or coronary anatomy or
pathology. The coronary CTA interpretation by the cardiologist is
attached.
HISTORY: Chest pain/anginal equiv, 10yr CHD risk 10-20%, not treadmill
candidate
Cardiac/Coronary CT
TECHNIQUE: The patient was scanned on a Siemens Force scanner.
PROTOCOL: A 120 kV prospective scan was triggered in the descending thoracic
aorta at 111 HU's. Axial non-contrast 3 mm slices were carried out
through the heart. The data set was analyzed on a dedicated work
station and scored using the Agatson method. Gantry rotation speed
was 250 msecs and collimation was 0.6 mm. Beta blockade and 0.8 mg
of sl NTG was given. The 3D data set was reconstructed in 5%
intervals of 35-75% of the R-R cycle. Diastolic phases were analyzed
on a dedicated work station using MPR, MIP and VRT modes. The
patient received 80mL OMNIPAQUE IOHEXOL 350 MG/ML SOLN of contrast.

[Series 6: best diast 70 % · axial · 0.39mm/px · z∈[-213,-172]mm · 2 of 313 slices shown]
[im 105/313  vessel]
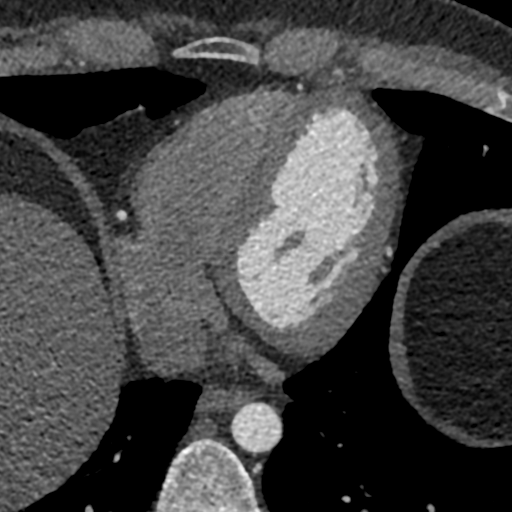
[im 209/313  vessel]
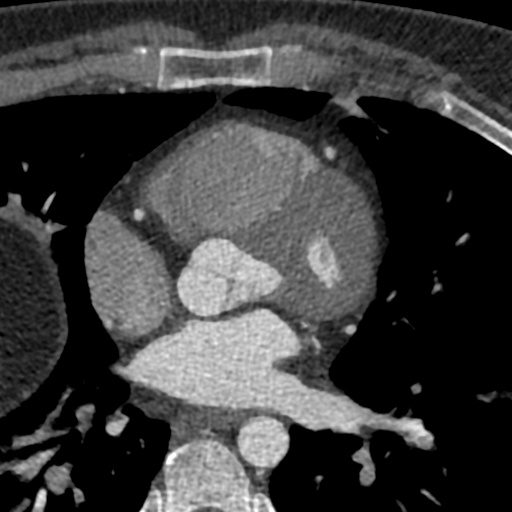

[Series 7: ts diast sharp · axial · 0.39mm/px · z∈[-213,-172]mm · 2 of 313 slices shown]
[im 105/313  lung]
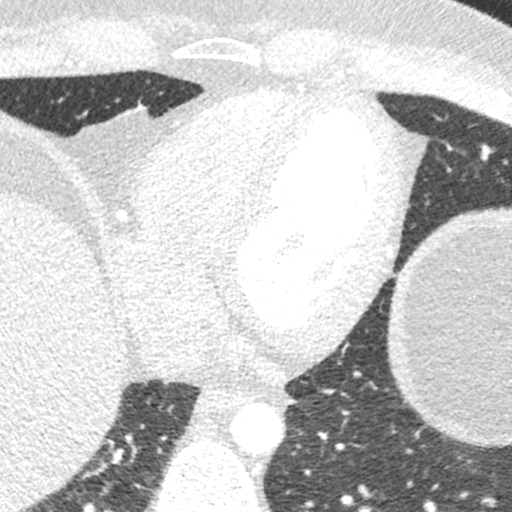
[im 209/313  lung]
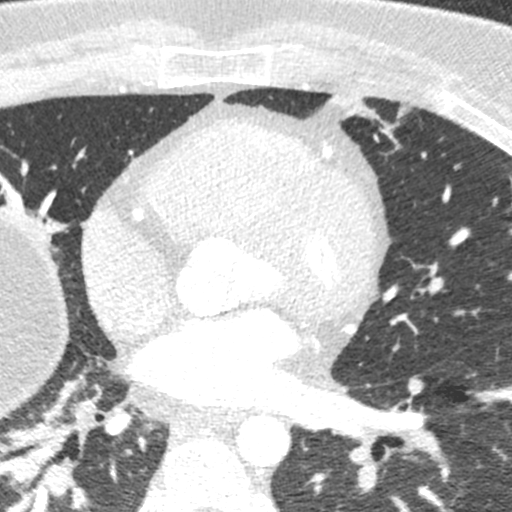

[Series 8: best syst · axial · 0.39mm/px · z∈[-213,-172]mm · 2 of 313 slices shown, 3 images]
[im 105/313  vessel]
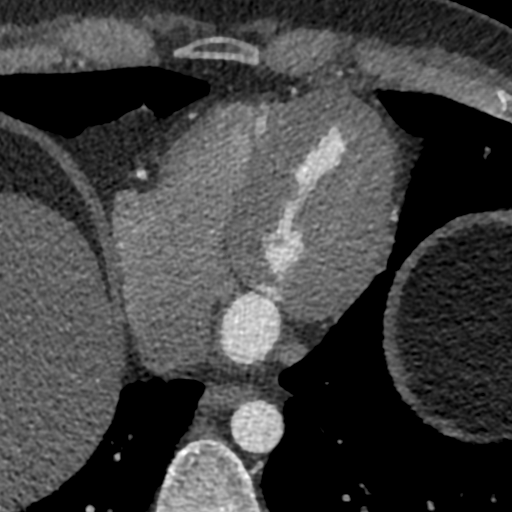
[im 105/313  lung]
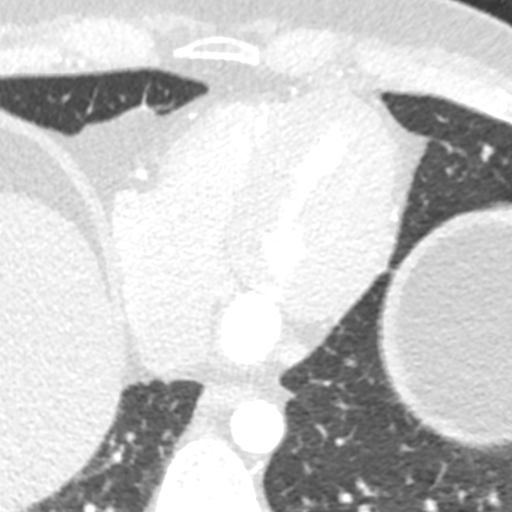
[im 209/313  vessel]
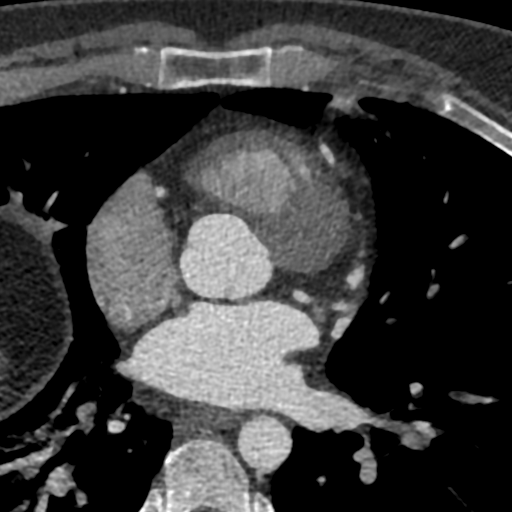

[Series 9: ts syst sharp · axial · 0.39mm/px · z∈[-213,-172]mm · 2 of 313 slices shown]
[im 105/313  lung]
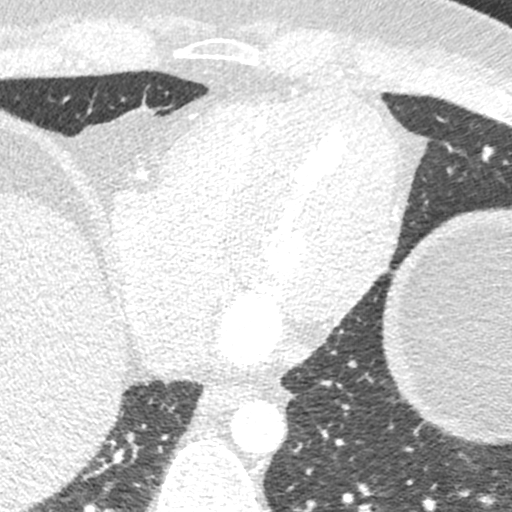
[im 209/313  lung]
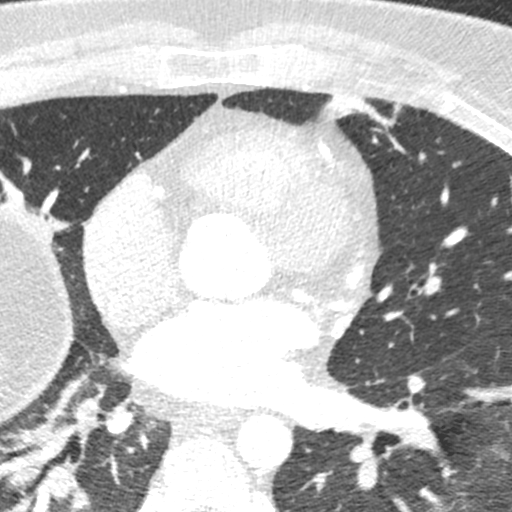

[8 of 20 positions shown; findings below may reference images not displayed]

FINDINGS: Vascular: Heart is normal size.  Visualized aorta normal caliber.

Mediastinum/Nodes: No adenopathy in the lower mediastinum or hila.

Lungs/Pleura: Chronic elevation of the right hemidiaphragm.
Platelike atelectasis or scarring at the right lung base. No
effusions.

Upper Abdomen: Imaging into the upper abdomen shows no acute
findings.

Musculoskeletal: Chest wall soft tissues are unremarkable. No acute
bony abnormality.
IMPRESSION: Stable chronic elevation of the right hemidiaphragm with right base
atelectasis or scarring.

No acute extra cardiac abnormality.
FINDINGS: Coronary calcium score: The patient's coronary artery calcium score
is 1, which places the patient in the 25th percentile.

Coronary arteries: Normal coronary origins.  Right dominance.

Right Coronary Artery: Normal caliber vessel, gives rise to PDA. No
significant plaque or stenosis.

Left Main Coronary Artery: Normal caliber vessel. No significant
plaque or stenosis.

Left Anterior Descending Coronary Artery: Normal caliber vessel. No
significant plaque or stenosis. Gives rise to 5 small diagonal
branches. Distal LAD wraps apex

Left Circumflex Artery: Normal caliber vessel. No significant plaque
or stenosis. Gives rise to 1 large OM branch.

Aorta: Normal size, 29 mm at the mid ascending aorta (level of the
PA bifurcation) measured double oblique. No calcifications. No
dissection.

Aortic Valve: No calcifications. Trileaflet.

Other findings:

Normal pulmonary vein drainage into the left atrium.

Normal left atrial appendage without a thrombus.

Normal size of the pulmonary artery.
IMPRESSION: 1. No evidence of CAD, CADRADS = 0.

2. Coronary calcium score of 1. This was 25th percentile for age and
sex matched control.

3. Normal coronary origin with right dominance.

*** End of Addendum ***
EXAM:
OVER-READ INTERPRETATION  CT CHEST

The following report is an over-read performed by radiologist Dr.
Nasana Rafiu [REDACTED] on 09/07/2019. This over-read
does not include interpretation of cardiac or coronary anatomy or
pathology. The coronary CTA interpretation by the cardiologist is
attached.
FINDINGS: Vascular: Heart is normal size.  Visualized aorta normal caliber.

Mediastinum/Nodes: No adenopathy in the lower mediastinum or hila.

Lungs/Pleura: Chronic elevation of the right hemidiaphragm.
Platelike atelectasis or scarring at the right lung base. No
effusions.

Upper Abdomen: Imaging into the upper abdomen shows no acute
findings.

Musculoskeletal: Chest wall soft tissues are unremarkable. No acute
bony abnormality.
IMPRESSION: Stable chronic elevation of the right hemidiaphragm with right base
atelectasis or scarring.

No acute extra cardiac abnormality.

## 2019-09-07 MED ORDER — AMLODIPINE BESYLATE 10 MG PO TABS: 10.0000 mg | ORAL_TABLET | Freq: Every day | ORAL | 1 refills | Status: DC

## 2019-09-07 MED ORDER — IOHEXOL 350 MG/ML SOLN
80.0000 mL | Freq: Once | INTRAVENOUS | Status: AC | PRN
  Administered 2019-09-07: 80 mL via INTRAVENOUS

## 2019-09-07 MED ORDER — NITROGLYCERIN 0.4 MG SL SUBL
SUBLINGUAL_TABLET | SUBLINGUAL | Status: AC
  Filled 2019-09-07: qty 2

## 2019-09-07 MED ORDER — NITROGLYCERIN 0.4 MG SL SUBL
0.8000 mg | SUBLINGUAL_TABLET | Freq: Once | SUBLINGUAL | Status: AC
  Administered 2019-09-07: 0.8 mg via SUBLINGUAL

## 2019-09-07 MED ORDER — ASPIRIN 81 MG PO TBEC: 81.0000 mg | DELAYED_RELEASE_TABLET | Freq: Every day | ORAL | 1 refills | Status: DC

## 2019-09-07 MED ORDER — METOPROLOL TARTRATE 5 MG/5ML IV SOLN
5.0000 mg | INTRAVENOUS | Status: DC | PRN
  Administered 2019-09-07: 5 mg via INTRAVENOUS

## 2019-09-07 MED ORDER — METOPROLOL TARTRATE 5 MG/5ML IV SOLN
INTRAVENOUS | Status: AC
  Filled 2019-09-07: qty 10

## 2019-09-07 MED FILL — AMLODIPINE BESYLATE 10 MG T: 10 | 30 days supply | Qty: 30 | Fill #0

## 2019-09-07 MED FILL — ASPIRIN 81 MG TBEC: 81 | 30 days supply | Qty: 30 | Fill #0

## 2019-10-10 MED FILL — AMLODIPINE BESYLATE 10 MG T: 10 | 30 days supply | Qty: 30 | Fill #1

## 2019-10-23 MED FILL — CARVEDILOL 25 MG TABLET: 25 | 90 days supply | Qty: 180 | Fill #1

## 2019-10-23 MED FILL — ASPIRIN 81 MG TBEC: 81 | 30 days supply | Qty: 30 | Fill #1

## 2019-10-27 ENCOUNTER — Ambulatory Visit: Payer: No Typology Code available for payment source | Admitting: Cardiovascular Disease

## 2019-11-09 LAB — LDL CHOLESTEROL, DIRECT: LDL Direct: 88 mg/dL (ref 0–99)

## 2019-11-09 LAB — LIPID PANEL
Chol/HDL Ratio: 5.8 ratio — ABNORMAL HIGH (ref 0.0–5.0)
Cholesterol, Total: 169 mg/dL (ref 100–199)
HDL: 29 mg/dL — ABNORMAL LOW (ref >39)
LDL Chol Calc (NIH): 79 mg/dL (ref 0–99)
Triglycerides: 375 mg/dL — ABNORMAL HIGH (ref 0–149)
VLDL Cholesterol Cal: 61 mg/dL — ABNORMAL HIGH (ref 5–40)

## 2019-11-13 ENCOUNTER — Other Ambulatory Visit: Payer: Self-pay | Admitting: Cardiovascular Disease

## 2019-11-13 MED FILL — AMLODIPINE BESYLATE 10 MG T: 10 | 30 days supply | Qty: 30 | Fill #0

## 2019-11-13 NOTE — Progress Notes (Signed)
Cardiology Office Note:   Date:  11/14/2019  NAME:  Ryan Gay    MRN: [id number] DOB:  23-Dec-1990   PCP:  Soundra Pilon, FNP  Cardiologist:  No primary care provider on file.   Referring MD: Soundra Pilon, FNP   Chief Complaint  Patient presents with  . Hyperlipidemia   History of Present Illness:   Ryan Gay is a 29 y.o. male with a hx of HTN, obesity, HLD who presents for follow-up of chest pain. CCTA normal. Lipitor increased to 40 mg QD. TGs still elevated.   Reports he is doing well.  He is reassured that his cardiac CTA was negative.  There was mention of coronary calcium score of 1 but I think this is artifact.  His triglycerides have come down with Lipitor.  He is actually lost 7 pounds since her last visit.  His weight is down to 296 pounds from 303 lbs. he reports he is working on diet but not exercising routinely.  He has 2 small children and this keeps him busy.  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  He denies any further episodes of chest pain.  His blood pressure is much better controlled today.  He does wear his sleep machine.  We did discuss increasing the Lipitor versus him continue to work on diet and exercising.  I think we can get his triglycerides much lower with changes in diet and exercise.  He reports he would like to pursue this.  Problem List 1. HTN 2. Obesity 3. Ryan 4.  Hyperlipidemia -Total cholesterol T chol 169, HDL 29, LDL 88, TG 375  Past Medical History: Past Medical History:  Diagnosis Date  . Anxiety   . Depression   . High cholesterol   . Hypertension   . Seizures (HCC)     Past Surgical History: Past Surgical History:  Procedure Laterality Date  . VASECTOMY    . WISDOM TOOTH EXTRACTION      Current Medications: Current Meds  Medication Sig  . amLODipine (NORVASC) 10 MG tablet Take 1 tablet (10 mg total) by mouth daily.  Marland Kitchen aspirin 81 MG EC tablet Take 1 tablet (81 mg total) by mouth daily.  . carvedilol (COREG) 25 MG tablet  Take 1 tablet (25 mg total) by mouth 2 (two) times daily with a meal.  . chlorthalidone (HYGROTON) 25 MG tablet Take 25 mg by mouth daily.   . colchicine 0.6 MG tablet Take 1 tablet (0.6 mg total) by mouth 2 (two) times daily.  Marland Kitchen losartan (COZAAR) 25 MG tablet Take 1 tablet (25 mg total) by mouth daily.  Marland Kitchen MAGNESIUM PO Take 1 tablet by mouth daily.  Marland Kitchen VITAMIN D PO Take 1 tablet by mouth daily.  . [DISCONTINUED] amLODipine (NORVASC) 10 MG tablet TAKE 1 TABLET BY MOUTH ONCE DAILY     Allergies:    Patient has no known allergies.   Social History: Social History   Socioeconomic History  . Marital status: Married    Spouse name: Not on file  . Number of children: 2  . Years of education: Not on file  . Highest education level: Not on file  Occupational History  . Occupation: [employer]  Tobacco Use  . Smoking status: Former Smoker    Years: 12.00    Quit date: 10/03/2016    Years since quitting: 3.1  . Smokeless tobacco: Never Used  Vaping Use  . Vaping Use: Every day  . Substances: Nicotine  Substance and Sexual  Activity  . Alcohol use: Yes    Comment: 4 drinks per wk  . Drug use: No  . Sexual activity: Yes    Partners: Female  Other Topics Concern  . Not on file  Social History Narrative   Lives at home w/ wife and kids   Right-handed   Caffeine: 1 Red Bull daily   Social Determinants of Health   Financial Resource Strain:   . Difficulty of Paying Living Expenses:   Food Insecurity:   . Worried About Programme researcher, broadcasting/film/videounning Out of Food in the Last Year:   . Baristaan Out of Food in the Last Year:   Transportation Needs:   . Freight forwarderLack of Transportation (Medical):   Marland Kitchen. Lack of Transportation (Non-Medical):   Physical Activity:   . Days of Exercise per Week:   . Minutes of Exercise per Session:   Stress:   . Feeling of Stress :   Social Connections:   . Frequency of Communication with Friends and Family:   . Frequency of Social Gatherings with Friends and Family:   . Attends  Religious Services:   . Active Member of Clubs or Organizations:   . Attends BankerClub or Organization Meetings:   Marland Kitchen. Marital Status:      Family History: The patient's family history includes Heart disease in his father; Hypertension in his father and mother; Hypothyroidism in his mother; Liver cancer in his maternal grandfather.  ROS:   All other ROS reviewed and negative. Pertinent positives noted in the HPI.     EKGs/Labs/Other Studies Reviewed:   The following studies were personally reviewed by me today:   TTE 08/10/2019 1. Left ventricular ejection fraction, by estimation, is 60 to 65%. The  left ventricle has normal function. The left ventricle has no regional  wall motion abnormalities. Left ventricular diastolic parameters were  normal.  2. Right ventricular systolic function is normal. The right ventricular  size is normal. Tricuspid regurgitation signal is inadequate for assessing  PA pressure.  3. The mitral valve is grossly normal. No evidence of mitral valve  regurgitation. No evidence of mitral stenosis.  4. The aortic valve is normal in structure. Aortic valve regurgitation is  not visualized. No aortic stenosis is present.   CCTA 09/08/2019 IMPRESSION: 1. No evidence of CAD, CADRADS = 0.  2. Coronary calcium score of 1. This was 25th percentile for age and sex matched control.  3. Normal coronary origin with right dominance.  Recent Labs: 07/05/2019: ALT 87 07/06/2019: Hemoglobin 14.7; Platelets 226 08/21/2019: BUN 12; Creatinine, Ser 0.72; Potassium 3.7; Sodium 140   Recent Lipid Panel    Component Value Date/Time   CHOL 169 11/09/2019 0842   TRIG 375 (H) 11/09/2019 0842   HDL 29 (L) 11/09/2019 0842   CHOLHDL 5.8 (H) 11/09/2019 0842   CHOLHDL 6.1 07/05/2019 0528   VLDL UNABLE TO CALCULATE IF TRIGLYCERIDE OVER 400 mg/dL 44/03/474202/24/2021 [id number]   LDLCALC 79 11/09/2019 0842   LDLDIRECT 88 11/09/2019 0842   LDLDIRECT 64.2 07/05/2019 0528    Physical Exam:     VS:  BP 124/84   Pulse 72   Ht 6\' 2"  (1.88 m)   Wt 296 lb (134.3 kg)   SpO2 96%   BMI 38.00 kg/m    Wt Readings from Last 3 Encounters:  11/14/19 296 lb (134.3 kg)  07/19/19 (!) 303 lb 3.2 oz (137.5 kg)  07/06/19 (!) 303 lb 8 oz (137.7 kg)    General: Well nourished, well developed, in no acute  distress Heart: Atraumatic, normal size  Eyes: PEERLA, EOMI  Neck: Supple, no JVD Endocrine: No thryomegaly Cardiac: Normal S1, S2; RRR; no murmurs, rubs, or gallops Lungs: Clear to auscultation bilaterally, no wheezing, rhonchi or rales  Abd: Soft, nontender, no hepatomegaly  Ext: No edema, pulses 2+ Musculoskeletal: No deformities, BUE and BLE strength normal and equal Skin: Warm and dry, no rashes   Neuro: Alert and oriented to person, place, time, and situation, CNII-XII grossly intact, no focal deficits  Psych: Normal mood and affect   ASSESSMENT:   Ikechukwu Cerny is a 29 y.o. male who presents for the following: 1. Chest pain, unspecified type   2. Mixed hyperlipidemia   3. Essential hypertension     PLAN:   1. Chest pain, unspecified type -Atypical chest pain.  Cardiac CTA negative.  Noncardiac pain suspected.  No further work-up needed.  Symptoms have resolved.  2. Mixed hyperlipidemia -Main issue is triglycerides.  His most recent triglycerides were 375 on Lipitor 40 mg daily.  We did discuss further increasing of the dose versus continue dietary modification and weight loss.  We have opted for diet modification and weight loss.  We will plan to see him back in 6 months and recheck his lipid profile 1 week before.  The main goal of treating hypertriglycerides is to prevent hypertriglyceridemia induced pancreatitis.  If his triglycerides are less than 500 he is very clear of any risk of this.  Currently he is stable.  3. Essential hypertension -Refill amlodipine today.  Disposition: Return in about 6 months (around 05/16/2020).  Medication Adjustments/Labs and Tests  Ordered: Current medicines are reviewed at length with the patient today.  Concerns regarding medicines are outlined above.  Orders Placed This Encounter  Procedures  . Lipid panel   Meds ordered this encounter  Medications  . amLODipine (NORVASC) 10 MG tablet    Sig: Take 1 tablet (10 mg total) by mouth daily.    Dispense:  90 tablet    Refill:  3  . atorvastatin (LIPITOR) 40 MG tablet    Sig: Take 1 tablet (40 mg total) by mouth daily.    Dispense:  90 tablet    Refill:  3    Patient Instructions  Medication Instructions:  The current medical regimen is effective;  continue present plan and medications.  *If you need a refill on your cardiac medications before your next appointment, please call your pharmacy*   Lab Work: LIPID (one week before follow up in 6 months, no lab appointment needed, come fasting- nothing to eat or drink)   If you have labs (blood work) drawn today and your tests are completely normal, you will receive your results only by: Marland Kitchen MyChart Message (if you have MyChart) OR . A paper copy in the mail If you have any lab test that is abnormal or we need to change your treatment, we will call you to review the results.   Follow-Up: At [facility], you and your health needs are our priority.  As part of our continuing mission to provide you with exceptional heart care, we have created designated Provider Care Teams.  These Care Teams include your primary Cardiologist (physician) and Advanced Practice Providers (APPs -  Physician Assistants and Nurse Practitioners) who all work together to provide you with the care you need, when you need it.  We recommend signing up for the patient portal called "MyChart".  Sign up information is provided on this After Visit Summary.  MyChart is  used to connect with patients for Virtual Visits (Telemedicine).  Patients are able to view lab/test results, encounter notes, upcoming appointments, etc.  Non-urgent messages can be  sent to your provider as well.   To learn more about what you can do with MyChart, go to [email or web address].    Your next appointment:   6 month(s)  The format for your next appointment:   In Person  Provider:   Lennie Odor, MD         Time Spent with Patient: I have spent a total of 25 minutes with patient reviewing hospital notes, telemetry, EKGs, labs and examining the patient as well as establishing an assessment and plan that was discussed with the patient.  > 50% of time was spent in direct patient care.  Signed, Lenna Gilford. Flora Lipps, MD [facility]  [address], [address] [place], [place] [address] [phone]  11/14/2019 8:12 AM

## 2019-11-14 ENCOUNTER — Other Ambulatory Visit: Payer: Self-pay | Admitting: Cardiovascular Disease

## 2019-11-14 ENCOUNTER — Encounter: Payer: Self-pay | Admitting: Cardiovascular Disease

## 2019-11-14 ENCOUNTER — Ambulatory Visit (INDEPENDENT_AMBULATORY_CARE_PROVIDER_SITE_OTHER): Payer: No Typology Code available for payment source | Admitting: Cardiovascular Disease

## 2019-11-14 ENCOUNTER — Other Ambulatory Visit: Payer: Self-pay

## 2019-11-14 VITALS — BP 124/84 | HR 72 | Ht 74.0 in | Wt 296.0 lb

## 2019-11-14 DIAGNOSIS — R079 Chest pain, unspecified: Secondary | ICD-10-CM

## 2019-11-14 DIAGNOSIS — E782 Mixed hyperlipidemia: Secondary | ICD-10-CM | POA: Diagnosis not present

## 2019-11-14 DIAGNOSIS — I10 Essential (primary) hypertension: Secondary | ICD-10-CM | POA: Diagnosis not present

## 2019-11-14 MED ORDER — ATORVASTATIN CALCIUM 40 MG PO TABS: 40.0000 mg | ORAL_TABLET | Freq: Every day | ORAL | 3 refills | Status: DC

## 2019-11-14 MED ORDER — AMLODIPINE BESYLATE 10 MG PO TABS: 10.0000 mg | ORAL_TABLET | Freq: Every day | ORAL | 3 refills | Status: DC

## 2019-11-14 MED FILL — ATORVASTATIN CALCIUM 40 MG: 40 | 90 days supply | Qty: 90 | Fill #0

## 2019-11-14 NOTE — Patient Instructions (Signed)
Medication Instructions:  The current medical regimen is effective;  continue present plan and medications.  *If you need a refill on your cardiac medications before your next appointment, please call your pharmacy*   Lab Work: LIPID (one week before follow up in 6 months, no lab appointment needed, come fasting- nothing to eat or drink)   If you have labs (blood work) drawn today and your tests are completely normal, you will receive your results only by:  MyChart Message (if you have MyChart) OR  A paper copy in the mail If you have any lab test that is abnormal or we need to change your treatment, we will call you to review the results.   Follow-Up: At [facility], you and your health needs are our priority.  As part of our continuing mission to provide you with exceptional heart care, we have created designated Provider Care Teams.  These Care Teams include your primary Cardiologist (physician) and Advanced Practice Providers (APPs -  Physician Assistants and Nurse Practitioners) who all work together to provide you with the care you need, when you need it.  We recommend signing up for the patient portal called "MyChart".  Sign up information is provided on this After Visit Summary.  MyChart is used to connect with patients for Virtual Visits (Telemedicine).  Patients are able to view lab/test results, encounter notes, upcoming appointments, etc.  Non-urgent messages can be sent to your provider as well.   To learn more about what you can do with MyChart, go to [email or web address].    Your next appointment:   6 month(s)  The format for your next appointment:   In Person  Provider:   Lennie Odor, MD

## 2019-11-27 ENCOUNTER — Other Ambulatory Visit: Payer: Self-pay | Admitting: *Deleted

## 2019-11-27 ENCOUNTER — Ambulatory Visit: Payer: No Typology Code available for payment source | Attending: Internal Medicine

## 2019-11-27 ENCOUNTER — Other Ambulatory Visit: Payer: No Typology Code available for payment source

## 2019-11-27 DIAGNOSIS — Z20822 Contact with and (suspected) exposure to covid-19: Secondary | ICD-10-CM

## 2019-11-28 LAB — NOVEL CORONAVIRUS, NAA: SARS-CoV-2, NAA: NOT DETECTED

## 2019-11-28 LAB — SARS-COV-2, NAA 2 DAY TAT

## 2019-12-05 ENCOUNTER — Other Ambulatory Visit: Payer: Self-pay | Admitting: Cardiovascular Disease

## 2019-12-05 MED FILL — LOSARTAN POTASSIUM 25 MG TA: 25 | 90 days supply | Qty: 90 | Fill #1

## 2019-12-05 MED FILL — CHLORTHALIDONE 25 MG TABS: 25 | 90 days supply | Qty: 90 | Fill #0

## 2019-12-06 ENCOUNTER — Other Ambulatory Visit: Payer: Self-pay | Admitting: Cardiovascular Disease

## 2019-12-06 MED FILL — ASPIRIN 81 MG TBEC: 81 | 90 days supply | Qty: 90 | Fill #0

## 2019-12-07 MED FILL — AMLODIPINE BESYLATE 10 MG T: 10 | 30 days supply | Qty: 30 | Fill #1

## 2019-12-23 MED FILL — predniSONE 10 MG (21) TBPK: 10 | 6 days supply | Qty: 21 | Fill #0

## 2020-01-16 ENCOUNTER — Other Ambulatory Visit: Payer: Self-pay | Admitting: Cardiovascular Disease

## 2020-01-16 MED FILL — AMLODIPINE BESYLATE 10 MG T: 10 | 90 days supply | Qty: 90 | Fill #0

## 2020-01-17 ENCOUNTER — Other Ambulatory Visit: Payer: Self-pay | Admitting: Cardiovascular Disease

## 2020-01-17 MED FILL — CARVEDILOL 25 MG TABLET: 25 | 90 days supply | Qty: 180 | Fill #0

## 2020-03-04 ENCOUNTER — Other Ambulatory Visit: Payer: Self-pay | Admitting: Cardiovascular Disease

## 2020-03-04 MED FILL — ASPIRIN LOW DOSE 81 MG TBEC: 81 | 90 days supply | Qty: 90 | Fill #1

## 2020-03-04 MED FILL — ATORVASTATIN 40 MG TABLET: 40 | 90 days supply | Qty: 90 | Fill #1

## 2020-03-05 ENCOUNTER — Other Ambulatory Visit: Payer: Self-pay | Admitting: Cardiovascular Disease

## 2020-03-05 MED FILL — CHLORTHALIDONE 25 MG TABS: 25 | 90 days supply | Qty: 90 | Fill #0

## 2020-03-05 MED FILL — LOSARTAN POTASSIUM 25 MG TA: 25 | 90 days supply | Qty: 90 | Fill #0

## 2020-04-16 MED FILL — AMLODIPINE BESYLATE 10 MG T: 10 | 90 days supply | Qty: 90 | Fill #1

## 2020-05-06 MED FILL — CARVEDILOL 25 MG TABS: 25 | 90 days supply | Qty: 180 | Fill #1

## 2020-05-14 ENCOUNTER — Ambulatory Visit: Payer: No Typology Code available for payment source | Admitting: Cardiovascular Disease

## 2020-05-30 ENCOUNTER — Other Ambulatory Visit: Payer: Self-pay

## 2020-05-30 DIAGNOSIS — E782 Mixed hyperlipidemia: Secondary | ICD-10-CM

## 2020-05-30 LAB — LIPID PANEL
Chol/HDL Ratio: 5 ratio (ref 0.0–5.0)
Cholesterol, Total: 145 mg/dL (ref 100–199)
HDL: 29 mg/dL — ABNORMAL LOW (ref >39)
LDL Chol Calc (NIH): 30 mg/dL (ref 0–99)
Triglycerides: 632 mg/dL (ref 0–149)
VLDL Cholesterol Cal: 86 mg/dL — ABNORMAL HIGH (ref 5–40)

## 2020-06-01 NOTE — Progress Notes (Signed)
Cardiology Office Note:   Date:  06/04/2020  NAME:  Ryan Gay    MRN: [id number] DOB:  1990-05-14   PCP:  Soundra Pilon, FNP  Cardiologist:  No primary care provider on file.   Referring MD: Soundra Pilon, FNP   Chief Complaint  Patient presents with  . Hyperlipidemia   History of Present Illness:   Ryan Gay is a 30 y.o. male with a hx of HTN, obesity, and HLD who presents for follow-up of HLD.  His weight is down 294 pounds.  He was 303 at her last visit.  He is working on losing weight and exercising more.  He is doing 30 minutes/day on the treadmill or walking in the evenings.  Blood pressure today 120/81.  He reports he can have elevated blood pressures in the afternoon.  He will start keeping a log.  He reports he had some chest pain when his blood pressure was high.  No further episodes.  No chest pain when he exerts himself.  No significant shortness of breath.  Fast food appears to be the issue.  He has small children.  He reports he is going to work on consuming less processed and fatty foods.  Triglycerides elevated.  We discussed going on fenofibrate.  He is okay to do this.  We also discussed he can stop his aspirin.  No strong indication for this.  He is quite young.  He is using his sleep apnea machine.  No issues.  Overall doing well, need to get his triglycerides lower.  Problem List 1. HTN 2. Obesity 3. OSA 4.Hyperlipidemia -T chol 145, HDL 29, LDL 30, TG 632  Past Medical History: Past Medical History:  Diagnosis Date  . Anxiety   . Depression   . High cholesterol   . Hypertension   . Seizures (HCC)     Past Surgical History: Past Surgical History:  Procedure Laterality Date  . VASECTOMY    . WISDOM TOOTH EXTRACTION      Current Medications: Current Meds  Medication Sig  . amLODipine (NORVASC) 10 MG tablet Take 1 tablet (10 mg total) by mouth daily.  Marland Kitchen atorvastatin (LIPITOR) 40 MG tablet Take 1 tablet (40 mg total) by mouth daily.  .  carvedilol (COREG) 25 MG tablet TAKE 1 TABLET BY MOUTH TWICE DAILY WITH MEALS  . chlorthalidone (HYGROTON) 25 MG tablet Take 25 mg by mouth daily.   . colchicine 0.6 MG tablet Take 1 tablet (0.6 mg total) by mouth 2 (two) times daily.  . fenofibrate (TRICOR) 145 MG tablet Take 1 tablet (145 mg total) by mouth daily.  Marland Kitchen losartan (COZAAR) 25 MG tablet TAKE 1 TABLET BY MOUTH ONCE DAILY  . MAGNESIUM PO Take 1 tablet by mouth daily.  Marland Kitchen VITAMIN D PO Take 1 tablet by mouth daily.  . [DISCONTINUED] aspirin 81 MG EC tablet TAKE 1 TABLET BY MOUTH ONCE DAILY     Allergies:    Patient has no known allergies.   Social History: Social History   Socioeconomic History  . Marital status: Married    Spouse name: Not on file  . Number of children: 2  . Years of education: Not on file  . Highest education level: Not on file  Occupational History  . Occupation: [employer]  Tobacco Use  . Smoking status: Former Smoker    Years: 12.00    Quit date: 10/03/2016    Years since quitting: 3.6  . Smokeless tobacco: Never Used  Vaping Use  . Vaping Use: Every day  . Substances: Nicotine  Substance and Sexual Activity  . Alcohol use: Yes    Comment: 4 drinks per wk  . Drug use: No  . Sexual activity: Yes    Partners: Female  Other Topics Concern  . Not on file  Social History Narrative   Lives at home w/ wife and kids   Right-handed   Caffeine: 1 Red Bull daily   Social Determinants of Health   Financial Resource Strain: Not on file  Food Insecurity: Not on file  Transportation Needs: Not on file  Physical Activity: Not on file  Stress: Not on file  Social Connections: Not on file     Family History: The patient's family history includes Heart disease in his father; Hypertension in his father and mother; Hypothyroidism in his mother; Liver cancer in his maternal grandfather.  ROS:   All other ROS reviewed and negative. Pertinent positives noted in the HPI.     EKGs/Labs/Other  Studies Reviewed:   The following studies were personally reviewed by me today:  TTE 08/10/2019 1. Left ventricular ejection fraction, by estimation, is 60 to 65%. The  left ventricle has normal function. The left ventricle has no regional  wall motion abnormalities. Left ventricular diastolic parameters were  normal.  2. Right ventricular systolic function is normal. The right ventricular  size is normal. Tricuspid regurgitation signal is inadequate for assessing  PA pressure.  3. The mitral valve is grossly normal. No evidence of mitral valve  regurgitation. No evidence of mitral stenosis.  4. The aortic valve is normal in structure. Aortic valve regurgitation is  not visualized. No aortic stenosis is present.   IMPRESSION: 1. No evidence of CAD, CADRADS = 0.  2. Coronary calcium score of 1. This was 25th percentile for age and sex matched control.  3. Normal coronary origin with right dominance.   Recent Labs: 07/05/2019: ALT 87 07/06/2019: Hemoglobin 14.7; Platelets 226 08/21/2019: BUN 12; Creatinine, Ser 0.72; Potassium 3.7; Sodium 140   Recent Lipid Panel    Component Value Date/Time   CHOL 145 05/30/2020 0837   TRIG 632 (HH) 05/30/2020 0837   HDL 29 (L) 05/30/2020 0837   CHOLHDL 5.0 05/30/2020 0837   CHOLHDL 6.1 07/05/2019 0528   VLDL UNABLE TO CALCULATE IF TRIGLYCERIDE OVER 400 mg/dL 84/16/6063 0160   LDLCALC 30 05/30/2020 0837   LDLDIRECT 88 11/09/2019 0842   LDLDIRECT 64.2 07/05/2019 0528    Physical Exam:   VS:  BP 120/81   Pulse 81   Ht 6' 1.25" (1.861 m)   Wt 294 lb 3.2 oz (133.4 kg)   BMI 38.55 kg/m    Wt Readings from Last 3 Encounters:  06/04/20 294 lb 3.2 oz (133.4 kg)  11/14/19 296 lb (134.3 kg)  07/19/19 (!) 303 lb 3.2 oz (137.5 kg)    General: Well nourished, well developed, in no acute distress Head: Atraumatic, normal size  Eyes: PEERLA, EOMI  Neck: Supple, no JVD Endocrine: No thryomegaly Cardiac: Normal S1, S2; RRR; no murmurs,  rubs, or gallops Lungs: Clear to auscultation bilaterally, no wheezing, rhonchi or rales  Abd: Soft, nontender, no hepatomegaly  Ext: No edema, pulses 2+ Musculoskeletal: No deformities, BUE and BLE strength normal and equal Skin: Warm and dry, no rashes   Neuro: Alert and oriented to person, place, time, and situation, CNII-XII grossly intact, no focal deficits  Psych: Normal mood and affect   ASSESSMENT:   Ryan Gay is  a 30 y.o. male who presents for the following: 1. Mixed hyperlipidemia   2. Primary hypertension   3. Obesity (BMI 30-39.9)     PLAN:   1. Mixed hyperlipidemia -Triglycerides over 500.  On Lipitor 40 mg a day.  Add fenofibrate 145 mg daily.  Would not want to cause any pancreas issues.  We will see him back in 3 months for repeat lipid profile as well as LFTs.  We discussed exercise and weight loss.  We also discussed modifying his diet.  He should avoid fatty foods or fried foods.  She also avoid high alcohol intake.  He will do this.  2. Primary hypertension -BP at goal today.  He will work on keeping a log.  Apparently he is having blood pressure elevations in the afternoon.  He will let us know how this goes.  3. Obesity (BMI 30-39.9) -Diet and exercise are recommended.  Disposition: Return in about 3 months (around 09/02/2020).  Medication Adjustments/Labs and Tests Ordered: Current medicines are reviewed at length with the patient today.  Concerns regarding medicines are outlined above.  Orders Placed This Encounter  Procedures  . Lipid panel  . Hepatic function panel   Meds ordered this encounter  Medications  . fenofibrate (TRICOR) 145 MG tablet    Sig: Take 1 tablet (145 mg total) by mouth daily.    Dispense:  90 tablet    Refill:  1    Patient Instructions  Medication Instructions:  Start Fenofibrate 145 mg daily  Stop Aspirin  *If you need a refill on your cardiac medications before your next appointment, please call your  pharmacy*   Lab Work: LIPID/LIVER (1 week before follow up appointment in 3 months, no lab appointment needed, come fasting- nothing to eat or drink)   If you have labs (blood work) drawn today and your tests are completely normal, you will receive your results only by: Marland Kitchen. MyChart Message (if you have MyChart) OR . A paper copy in the mail If you have any lab test that is abnormal or we need to change your treatment, we will call you to review the results.     Follow-Up: At [facility], you and your health needs are our priority.  As part of our continuing mission to provide you with exceptional heart care, we have created designated Provider Care Teams.  These Care Teams include your primary Cardiologist (physician) and Advanced Practice Providers (APPs -  Physician Assistants and Nurse Practitioners) who all work together to provide you with the care you need, when you need it.  We recommend signing up for the patient portal called "MyChart".  Sign up information is provided on this After Visit Summary.  MyChart is used to connect with patients for Virtual Visits (Telemedicine).  Patients are able to view lab/test results, encounter notes, upcoming appointments, etc.  Non-urgent messages can be sent to your provider as well.   To learn more about what you can do with MyChart, go to [email or web address].    Your next appointment:   3 month(s)  The format for your next appointment:   In Person  Provider:   Lennie OdorWesley O'Neal, MD        Time Spent with Patient: I have spent a total of 25 minutes with patient reviewing hospital notes, telemetry, EKGs, labs and examining the patient as well as establishing an assessment and plan that was discussed with the patient.  > 50% of time was spent in direct patient care.  Signed, Lenna Gilford. Flora Lipps, MD [facility]  [address], [address] [place], [place] [address] [phone]  06/04/2020 8:23 AM

## 2020-06-03 MED FILL — ASPIRIN 81 MG TBEC: 81 | 90 days supply | Qty: 90 | Fill #2

## 2020-06-03 MED FILL — CHLORTHALIDONE 25 MG TABS: 25 | 90 days supply | Qty: 90 | Fill #1

## 2020-06-03 MED FILL — LOSARTAN POTASSIUM 25 MG TA: 25 | 90 days supply | Qty: 90 | Fill #1

## 2020-06-04 ENCOUNTER — Other Ambulatory Visit: Payer: Self-pay

## 2020-06-04 ENCOUNTER — Ambulatory Visit (INDEPENDENT_AMBULATORY_CARE_PROVIDER_SITE_OTHER): Payer: No Typology Code available for payment source | Admitting: Cardiovascular Disease

## 2020-06-04 ENCOUNTER — Other Ambulatory Visit: Payer: Self-pay | Admitting: Cardiovascular Disease

## 2020-06-04 ENCOUNTER — Encounter: Payer: Self-pay | Admitting: Cardiovascular Disease

## 2020-06-04 VITALS — BP 120/81 | HR 81 | Ht 73.25 in | Wt 294.2 lb

## 2020-06-04 DIAGNOSIS — I10 Essential (primary) hypertension: Secondary | ICD-10-CM | POA: Diagnosis not present

## 2020-06-04 DIAGNOSIS — E669 Obesity, unspecified: Secondary | ICD-10-CM | POA: Diagnosis not present

## 2020-06-04 DIAGNOSIS — E782 Mixed hyperlipidemia: Secondary | ICD-10-CM | POA: Diagnosis not present

## 2020-06-04 MED ORDER — FENOFIBRATE 145 MG PO TABS: 145.0000 mg | ORAL_TABLET | Freq: Every day | ORAL | 1 refills | Status: DC

## 2020-06-04 MED FILL — FENOFIBRATE 145 MG TABS: 145 | 90 days supply | Qty: 90 | Fill #0

## 2020-06-04 NOTE — Patient Instructions (Addendum)
Medication Instructions:  Start Fenofibrate 145 mg daily  Stop Aspirin  *If you need a refill on your cardiac medications before your next appointment, please call your pharmacy*   Lab Work: LIPID/LIVER (1 week before follow up appointment in 3 months, no lab appointment needed, come fasting- nothing to eat or drink)   If you have labs (blood work) drawn today and your tests are completely normal, you will receive your results only by: Marland Kitchen MyChart Message (if you have MyChart) OR . A paper copy in the mail If you have any lab test that is abnormal or we need to change your treatment, we will call you to review the results.     Follow-Up: At [facility], you and your health needs are our priority.  As part of our continuing mission to provide you with exceptional heart care, we have created designated Provider Care Teams.  These Care Teams include your primary Cardiologist (physician) and Advanced Practice Providers (APPs -  Physician Assistants and Nurse Practitioners) who all work together to provide you with the care you need, when you need it.  We recommend signing up for the patient portal called "MyChart".  Sign up information is provided on this After Visit Summary.  MyChart is used to connect with patients for Virtual Visits (Telemedicine).  Patients are able to view lab/test results, encounter notes, upcoming appointments, etc.  Non-urgent messages can be sent to your provider as well.   To learn more about what you can do with MyChart, go to [email or web address].    Your next appointment:   3 month(s)  The format for your next appointment:   In Person  Provider:   Lennie Odor, MD

## 2020-06-11 MED FILL — ATORVASTATIN 40 MG TABLET: 40 | 90 days supply | Qty: 90 | Fill #2

## 2020-07-18 ENCOUNTER — Other Ambulatory Visit (HOSPITAL_COMMUNITY): Payer: Self-pay | Admitting: Pharmacist

## 2020-07-18 MED FILL — AMLODIPINE BESYLATE 10 MG T: 10 | 90 days supply | Qty: 90 | Fill #2

## 2020-07-18 MED FILL — CARESTART COVID-19 HOME TES: 4 days supply | Qty: 4 | Fill #0

## 2020-08-05 MED FILL — CARVEDILOL 25 MG TABS: 25 | 90 days supply | Qty: 180 | Fill #2

## 2020-08-05 MED FILL — CARESTART COVID-19 HOME TES: 4 days supply | Qty: 4 | Fill #0

## 2020-09-03 ENCOUNTER — Other Ambulatory Visit (HOSPITAL_COMMUNITY): Payer: Self-pay

## 2020-09-03 MED FILL — Losartan Potassium Tab 25 MG: ORAL | 90 days supply | Qty: 90 | Fill #0 | Status: AC

## 2020-09-03 MED FILL — Fenofibrate Tab 145 MG: ORAL | 90 days supply | Qty: 90 | Fill #0 | Status: AC

## 2020-09-03 MED FILL — Chlorthalidone Tab 25 MG: ORAL | 90 days supply | Qty: 90 | Fill #0 | Status: AC

## 2020-09-03 MED FILL — Atorvastatin Calcium Tab 40 MG (Base Equivalent): ORAL | 90 days supply | Qty: 90 | Fill #0 | Status: AC

## 2020-09-25 LAB — HEPATIC FUNCTION PANEL
ALT: 36 IU/L (ref 0–44)
AST: 28 IU/L (ref 0–40)
Albumin: 4.9 g/dL (ref 4.0–5.0)
Alkaline Phosphatase: 78 IU/L (ref 44–121)
Bilirubin Total: 0.4 mg/dL (ref 0.0–1.2)
Bilirubin, Direct: 0.13 mg/dL (ref 0.00–0.40)
Total Protein: 7.2 g/dL (ref 6.0–8.5)

## 2020-09-25 LAB — LIPID PANEL
Chol/HDL Ratio: 5 ratio (ref 0.0–5.0)
Cholesterol, Total: 154 mg/dL (ref 100–199)
HDL: 31 mg/dL — ABNORMAL LOW (ref >39)
LDL Chol Calc (NIH): 91 mg/dL (ref 0–99)
Triglycerides: 186 mg/dL — ABNORMAL HIGH (ref 0–149)
VLDL Cholesterol Cal: 32 mg/dL (ref 5–40)

## 2020-10-15 ENCOUNTER — Other Ambulatory Visit (HOSPITAL_COMMUNITY): Payer: Self-pay

## 2020-10-15 MED FILL — Amlodipine Besylate Tab 10 MG (Base Equivalent): ORAL | 90 days supply | Qty: 90 | Fill #0 | Status: AC

## 2020-10-16 ENCOUNTER — Telehealth: Payer: Self-pay | Admitting: Cardiovascular Disease

## 2020-10-16 NOTE — Telephone Encounter (Signed)
    Pt have an appt with Dr. Flora Lipps on Monday 06/27. He wanted to ask if he can bring his kids with him 59 and 32 years old, he is having hard time to get a Comptroller

## 2020-10-16 NOTE — Telephone Encounter (Signed)
Called patient, advised of the new babysitting service for [facility].  Patient states he will check this out.  Patient was given information for [facility] and states to call back with any issues.

## 2020-11-03 NOTE — Progress Notes (Signed)
[facility]  Cardiology Office Note:    Date:  11/04/2020  NAME:  Ryan Gay    MRN: [id number] DOB:  July 26, 1990   PCP:  Kristen Loader, FNP  Cardiologist:  None  Electrophysiologist:  None   Referring MD: Kristen Loader, FNP   Chief Complaint  Patient presents with   Follow-up   History of Present Illness:    Ryan Gay is a 30 y.o. male with a hx of hypertension, OSA, obesity, hyperlipidemia who presents for follow-up of hyperlipidemia.  Restarted fenofibrate.  Triglycerides 186.  Doing much better.  He denies any chest pain or shortness of breath.  BP 118/84.  He is working on his diet.  He is lost 25 pounds.  Weight is down from 303 pounds at 278.  He is working on his diet.  He reports that his wife is doing midwife clinical work.  She is in a different city 4 days/week.  This is stressful as he is 2 children.  He has 2 boys.  42 years of age.  They present with him today.  Well behaved.  He overall seems to be doing well.  We have his cholesterol where it needs to be.  He is tolerating all of his medications very well.  No refills needed.  Problem List 1. HTN 2. Obesity 3. OSA 4.  Hyperlipidemia -T chol 154, HDL 31, LDL 91, TG 186 5. Calcium score 1 (75th percentile)  Past Medical History: Past Medical History:  Diagnosis Date   Anxiety    Depression    High cholesterol    Hypertension    Seizures ([place])     Past Surgical History: Past Surgical History:  Procedure Laterality Date   VASECTOMY     WISDOM TOOTH EXTRACTION      Current Medications: Current Meds  Medication Sig   amLODipine (NORVASC) 10 MG tablet TAKE 1 TABLET BY MOUTH ONCE DAILY   atorvastatin (LIPITOR) 40 MG tablet TAKE 1 TABLET BY MOUTH ONCE DAILY   carvedilol (COREG) 25 MG tablet TAKE 1 TABLET BY MOUTH TWICE DAILY WITH MEALS   chlorthalidone (HYGROTON) 25 MG tablet Take 25 mg by mouth daily.    colchicine 0.6 MG tablet Take 0.6 mg by mouth as  needed.   COVID-19 At Home Antigen Test KIT USE AS DIRECTED   fenofibrate (TRICOR) 145 MG tablet TAKE 1 TABLET BY MOUTH ONCE A DAY   losartan (COZAAR) 25 MG tablet Take 1 tablet by mouth daily.   MAGNESIUM PO Take 1 tablet by mouth daily.   VITAMIN D PO Take 1 tablet by mouth daily.   [DISCONTINUED] losartan (COZAAR) 25 MG tablet TAKE 1 TABLET BY MOUTH ONCE DAILY     Allergies:    Patient has no known allergies.   Social History: Social History   Socioeconomic History   Marital status: Married    Spouse name: Not on file   Number of children: 2   Years of education: Not on file   Highest education level: Not on file  Occupational History   Occupation: [employer]  Tobacco Use   Smoking status: Former    Years: 12.00    Pack years: 0.00    Types: Cigarettes    Quit date: 10/03/2016    Years since quitting: 4.0   Smokeless tobacco: Never  Vaping Use   Vaping Use: Every day   Substances: Nicotine  Substance and Sexual Activity   Alcohol  use: Yes    Comment: 4 drinks per wk   Drug use: No   Sexual activity: Yes    Partners: Female  Other Topics Concern   Not on file  Social History Narrative   Lives at home w/ wife and kids   Right-handed   Caffeine: 1 Physiological scientist daily   Social Determinants of Health   Financial Resource Strain: Not on file  Food Insecurity: Not on file  Transportation Needs: Not on file  Physical Activity: Not on file  Stress: Not on file  Social Connections: Not on file     Family History: The patient's family history includes Heart disease in his father; Hypertension in his father and mother; Hypothyroidism in his mother; Liver cancer in his maternal grandfather.  ROS:   All other ROS reviewed and negative. Pertinent positives noted in the HPI.    EKGs/Labs/Other Studies Reviewed:   The following studies were personally reviewed by me today:  Recent Labs: 09/25/2020: ALT 36   Recent Lipid Panel    Component Value Date/Time    CHOL 154 09/25/2020 0931   TRIG 186 (H) 09/25/2020 0931   HDL 31 (L) 09/25/2020 0931   CHOLHDL 5.0 09/25/2020 0931   CHOLHDL 6.1 07/05/2019 0528   VLDL UNABLE TO CALCULATE IF TRIGLYCERIDE OVER 400 mg/dL 07/05/2019 0528   LDLCALC 91 09/25/2020 0931   LDLDIRECT 88 11/09/2019 0842   LDLDIRECT 64.2 07/05/2019 0528    Physical Exam:   VS:  BP 118/84   Pulse 70   Ht 6' 2"  (1.88 m)   Wt 278 lb 9.6 oz (126.4 kg)   SpO2 96%   BMI 35.77 kg/m    Wt Readings from Last 3 Encounters:  11/04/20 278 lb 9.6 oz (126.4 kg)  06/04/20 294 lb 3.2 oz (133.4 kg)  11/14/19 296 lb (134.3 kg)    General: Well nourished, well developed, in no acute distress Head: Atraumatic, normal size  Eyes: PEERLA, EOMI  Neck: Supple, no JVD Endocrine: No thryomegaly Cardiac: Normal S1, S2; RRR; no murmurs, rubs, or gallops Lungs: Clear to auscultation bilaterally, no wheezing, rhonchi or rales  Abd: Soft, nontender, no hepatomegaly  Ext: No edema, pulses 2+ Musculoskeletal: No deformities, BUE and BLE strength normal and equal Skin: Warm and dry, no rashes   Neuro: Alert and oriented to person, place, time, and situation, CNII-XII grossly intact, no focal deficits  Psych: Normal mood and affect   ASSESSMENT:   Ryan Gay is a 30 y.o. male who presents for the following: 1. Mixed hyperlipidemia   2. Primary hypertension   3. Obesity (BMI 30-39.9)     PLAN:   1. Mixed hyperlipidemia -His main issue has been hypertriglyceridemia.  His most recent profile shows his triglycerides 186.  He is on fenofibrate as well as Lipitor 40 mg daily.  He has also lost 25 pounds.  He has a goal to lose 50 more.  I have encouraged him to do this.  I would recommend he continue his current dose of Lipitor as well as fenofibrate.  There can be an interaction with fenofibrate and Lipitor however his lipid profile shows he is tolerating this well.  He will see [place] yearly.  I recommended regular exercise.  He also work on reducing  his fried and fatty food consumption.  He overall is doing remarkably well.  2. Primary hypertension -Blood pressure very well controlled.  Continue amlodipine 10 mg daily, Coreg 25 twice daily, chlorthalidone 25 mg daily, losartan 25  mg daily.  We will have to keep a close eye on his blood pressure.  He may end up coming off some of these if he continues to lose significant amounts of weight.  3. Obesity (BMI 30-39.9) -Weight is down 25 pounds since last year.  I recommended to continue with his weight loss goal.  Disposition: Return in about 1 year (around 11/04/2021).  Medication Adjustments/Labs and Tests Ordered: Current medicines are reviewed at length with the patient today.  Concerns regarding medicines are outlined above.  No orders of the defined types were placed in this encounter.  No orders of the defined types were placed in this encounter.   Patient Instructions  Medication: The current medical regimen is effective;  continue present plan and medications.   Follow up: 12 months with Dr.O'Neal     Time Spent with Patient: I have spent a total of 25 minutes with patient reviewing hospital notes, telemetry, EKGs, labs and examining the patient as well as establishing an assessment and plan that was discussed with the patient.  > 50% of time was spent in direct patient care.  Signed, Addison Naegeli. Audie Box, MD, [facility]  [address], [place], [place] [address] [phone]  11/04/2020 12:47 PM

## 2020-11-04 ENCOUNTER — Other Ambulatory Visit: Payer: Self-pay

## 2020-11-04 ENCOUNTER — Ambulatory Visit (INDEPENDENT_AMBULATORY_CARE_PROVIDER_SITE_OTHER): Payer: No Typology Code available for payment source | Admitting: Cardiovascular Disease

## 2020-11-04 ENCOUNTER — Encounter (HOSPITAL_BASED_OUTPATIENT_CLINIC_OR_DEPARTMENT_OTHER): Payer: Self-pay | Admitting: Cardiovascular Disease

## 2020-11-04 VITALS — BP 118/84 | HR 70 | Ht 74.0 in | Wt 278.6 lb

## 2020-11-04 DIAGNOSIS — I10 Essential (primary) hypertension: Secondary | ICD-10-CM | POA: Diagnosis not present

## 2020-11-04 DIAGNOSIS — E669 Obesity, unspecified: Secondary | ICD-10-CM | POA: Diagnosis not present

## 2020-11-04 DIAGNOSIS — E782 Mixed hyperlipidemia: Secondary | ICD-10-CM | POA: Diagnosis not present

## 2020-11-04 NOTE — Patient Instructions (Addendum)
Medication: The current medical regimen is effective;  continue present plan and medications.   Follow up: 12 months with Dr.O'Neal

## 2020-11-07 ENCOUNTER — Other Ambulatory Visit (HOSPITAL_COMMUNITY): Payer: Self-pay

## 2020-11-07 MED FILL — Carvedilol Tab 25 MG: ORAL | 90 days supply | Qty: 180 | Fill #0 | Status: AC

## 2020-11-26 ENCOUNTER — Other Ambulatory Visit: Payer: Self-pay

## 2020-11-26 ENCOUNTER — Other Ambulatory Visit: Payer: Self-pay | Admitting: Cardiovascular Disease

## 2020-11-26 ENCOUNTER — Other Ambulatory Visit (HOSPITAL_COMMUNITY): Payer: Self-pay

## 2020-11-26 MED ORDER — LOSARTAN POTASSIUM 25 MG PO TABS
ORAL_TABLET | Freq: Every day | ORAL | 3 refills | Status: DC
  Filled 2020-11-26: qty 90, 90d supply, fill #0
  Filled 2021-02-23: qty 90, 90d supply, fill #1
  Filled 2021-05-28: qty 90, 90d supply, fill #2
  Filled 2021-08-24: qty 90, 90d supply, fill #3

## 2020-11-26 MED ORDER — FENOFIBRATE 145 MG PO TABS
ORAL_TABLET | Freq: Every day | ORAL | 1 refills | Status: DC
  Filled 2020-11-26: qty 90, 90d supply, fill #0
  Filled 2021-03-03: qty 90, 90d supply, fill #1

## 2020-11-27 ENCOUNTER — Other Ambulatory Visit (HOSPITAL_COMMUNITY): Payer: Self-pay

## 2020-11-27 MED ORDER — CHLORTHALIDONE 25 MG PO TABS
25.0000 mg | ORAL_TABLET | Freq: Every morning | ORAL | 0 refills | Status: DC
  Filled 2020-11-27: qty 90, 90d supply, fill #0

## 2020-11-29 ENCOUNTER — Other Ambulatory Visit (HOSPITAL_COMMUNITY): Payer: Self-pay

## 2020-12-10 ENCOUNTER — Other Ambulatory Visit (HOSPITAL_COMMUNITY): Payer: Self-pay

## 2020-12-10 ENCOUNTER — Other Ambulatory Visit: Payer: Self-pay | Admitting: Cardiovascular Disease

## 2020-12-10 MED ORDER — ATORVASTATIN CALCIUM 40 MG PO TABS
ORAL_TABLET | Freq: Every day | ORAL | 3 refills | Status: DC
  Filled 2020-12-10: qty 90, 90d supply, fill #0
  Filled 2021-03-10: qty 90, 90d supply, fill #1
  Filled 2021-06-12: qty 90, 90d supply, fill #2
  Filled 2021-09-10: qty 90, 90d supply, fill #3

## 2021-01-11 ENCOUNTER — Other Ambulatory Visit: Payer: Self-pay | Admitting: Cardiovascular Disease

## 2021-01-14 ENCOUNTER — Other Ambulatory Visit (HOSPITAL_COMMUNITY): Payer: Self-pay

## 2021-01-14 MED ORDER — AMLODIPINE BESYLATE 10 MG PO TABS
ORAL_TABLET | Freq: Every day | ORAL | 3 refills | Status: DC
  Filled 2021-01-14: qty 90, 90d supply, fill #0
  Filled 2021-04-10: qty 90, 90d supply, fill #1
  Filled 2021-07-11: qty 90, 90d supply, fill #2
  Filled 2021-10-11: qty 90, 90d supply, fill #3
  Filled ????-??-??: fill #3

## 2021-01-15 ENCOUNTER — Other Ambulatory Visit (HOSPITAL_COMMUNITY): Payer: Self-pay

## 2021-01-15 MED ORDER — ALBUTEROL SULFATE HFA 108 (90 BASE) MCG/ACT IN AERS
INHALATION_SPRAY | RESPIRATORY_TRACT | 2 refills | Status: DC
  Filled 2021-01-15: qty 18, 30d supply, fill #0

## 2021-01-15 MED ORDER — AMOXICILLIN 875 MG PO TABS
ORAL_TABLET | ORAL | 0 refills | Status: DC
  Filled 2021-01-15: qty 14, 7d supply, fill #0

## 2021-01-15 MED ORDER — FLUTICASONE PROPIONATE HFA 110 MCG/ACT IN AERO
INHALATION_SPRAY | RESPIRATORY_TRACT | 3 refills | Status: AC
  Filled 2021-01-15: qty 12, 30d supply, fill #0
  Filled 2021-03-31: qty 12, 30d supply, fill #1
  Filled 2021-08-06: qty 12, 30d supply, fill #2

## 2021-02-06 ENCOUNTER — Other Ambulatory Visit: Payer: Self-pay | Admitting: Cardiovascular Disease

## 2021-02-07 ENCOUNTER — Other Ambulatory Visit (HOSPITAL_COMMUNITY): Payer: Self-pay

## 2021-02-07 MED ORDER — CARVEDILOL 25 MG PO TABS
ORAL_TABLET | Freq: Two times a day (BID) | ORAL | 3 refills | Status: DC
  Filled 2021-02-07: qty 180, 90d supply, fill #0
  Filled 2021-05-06: qty 180, 90d supply, fill #1
  Filled 2021-08-06: qty 180, 90d supply, fill #2
  Filled 2021-11-04: qty 180, 90d supply, fill #3

## 2021-02-23 ENCOUNTER — Other Ambulatory Visit (HOSPITAL_COMMUNITY): Payer: Self-pay

## 2021-02-24 ENCOUNTER — Other Ambulatory Visit (HOSPITAL_COMMUNITY): Payer: Self-pay

## 2021-02-24 MED ORDER — CHLORTHALIDONE 25 MG PO TABS
ORAL_TABLET | ORAL | 1 refills | Status: DC
  Filled 2021-02-24: qty 90, 90d supply, fill #0
  Filled 2021-05-28: qty 90, 90d supply, fill #1

## 2021-03-04 ENCOUNTER — Other Ambulatory Visit (HOSPITAL_COMMUNITY): Payer: Self-pay

## 2021-03-10 ENCOUNTER — Other Ambulatory Visit (HOSPITAL_COMMUNITY): Payer: Self-pay

## 2021-03-31 ENCOUNTER — Other Ambulatory Visit (HOSPITAL_COMMUNITY): Payer: Self-pay

## 2021-04-10 ENCOUNTER — Other Ambulatory Visit (HOSPITAL_COMMUNITY): Payer: Self-pay

## 2021-05-06 ENCOUNTER — Other Ambulatory Visit (HOSPITAL_COMMUNITY): Payer: Self-pay

## 2021-05-19 ENCOUNTER — Other Ambulatory Visit (HOSPITAL_COMMUNITY): Payer: Self-pay

## 2021-05-19 MED ORDER — PANTOPRAZOLE SODIUM 40 MG PO TBEC
DELAYED_RELEASE_TABLET | ORAL | 0 refills | Status: DC
  Filled 2021-05-19: qty 90, 90d supply, fill #0

## 2021-05-29 ENCOUNTER — Other Ambulatory Visit (HOSPITAL_COMMUNITY): Payer: Self-pay

## 2021-06-12 ENCOUNTER — Other Ambulatory Visit: Payer: Self-pay | Admitting: Cardiovascular Disease

## 2021-06-13 ENCOUNTER — Other Ambulatory Visit (HOSPITAL_COMMUNITY): Payer: Self-pay

## 2021-06-13 MED ORDER — FENOFIBRATE 145 MG PO TABS
ORAL_TABLET | Freq: Every day | ORAL | 1 refills | Status: DC
  Filled 2021-06-13: qty 90, 90d supply, fill #0
  Filled 2021-09-10: qty 90, 90d supply, fill #1

## 2021-07-12 ENCOUNTER — Other Ambulatory Visit (HOSPITAL_COMMUNITY): Payer: Self-pay

## 2021-08-06 ENCOUNTER — Other Ambulatory Visit (HOSPITAL_COMMUNITY): Payer: Self-pay

## 2021-08-24 ENCOUNTER — Other Ambulatory Visit (HOSPITAL_COMMUNITY): Payer: Self-pay

## 2021-08-25 ENCOUNTER — Other Ambulatory Visit (HOSPITAL_COMMUNITY): Payer: Self-pay

## 2021-08-25 MED ORDER — CHLORTHALIDONE 25 MG PO TABS
ORAL_TABLET | ORAL | 1 refills | Status: DC
  Filled 2021-08-25: qty 90, 90d supply, fill #0
  Filled 2021-11-25: qty 90, 90d supply, fill #1

## 2021-09-11 ENCOUNTER — Other Ambulatory Visit (HOSPITAL_COMMUNITY): Payer: Self-pay

## 2021-09-22 ENCOUNTER — Other Ambulatory Visit (HOSPITAL_COMMUNITY): Payer: Self-pay

## 2021-09-22 MED ORDER — PANTOPRAZOLE SODIUM 40 MG PO TBEC
DELAYED_RELEASE_TABLET | ORAL | 3 refills | Status: DC
  Filled 2021-09-22: qty 90, 90d supply, fill #0
  Filled 2021-12-17: qty 90, 90d supply, fill #1
  Filled 2022-03-20: qty 90, 90d supply, fill #2
  Filled 2022-06-14: qty 90, 90d supply, fill #3

## 2021-09-22 MED ORDER — WEGOVY 0.25 MG/0.5ML ~~LOC~~ SOAJ
SUBCUTANEOUS | 0 refills | Status: DC
  Filled 2021-09-22: qty 2, 30d supply, fill #0

## 2021-09-23 ENCOUNTER — Encounter (HOSPITAL_BASED_OUTPATIENT_CLINIC_OR_DEPARTMENT_OTHER): Payer: Self-pay | Admitting: Cardiovascular Disease

## 2021-10-02 ENCOUNTER — Other Ambulatory Visit (HOSPITAL_COMMUNITY): Payer: Self-pay

## 2021-10-11 ENCOUNTER — Other Ambulatory Visit (HOSPITAL_COMMUNITY): Payer: Self-pay

## 2021-10-14 ENCOUNTER — Other Ambulatory Visit (HOSPITAL_COMMUNITY): Payer: Self-pay

## 2021-11-04 ENCOUNTER — Other Ambulatory Visit (HOSPITAL_COMMUNITY): Payer: Self-pay

## 2021-11-05 ENCOUNTER — Other Ambulatory Visit (HOSPITAL_COMMUNITY): Payer: Self-pay

## 2021-11-08 ENCOUNTER — Other Ambulatory Visit (HOSPITAL_COMMUNITY): Payer: Self-pay

## 2021-11-10 ENCOUNTER — Other Ambulatory Visit (HOSPITAL_COMMUNITY): Payer: Self-pay

## 2021-11-10 MED ORDER — WEGOVY 0.25 MG/0.5ML ~~LOC~~ SOAJ
SUBCUTANEOUS | 0 refills | Status: DC
  Filled 2021-11-10: qty 2, 28d supply, fill #0

## 2021-11-10 NOTE — Progress Notes (Unsigned)
Cardiology Office Note:    Date:  11/12/2021  NAME:  Ryan Gay    MRN: [id number] DOB:  1990/09/13   PCP:  Soundra Pilon, FNP  Cardiologist:  None  Electrophysiologist:  None   Referring MD: Soundra Pilon, FNP   Chief Complaint  Patient presents with   Follow-up   History of Present Illness:    Ryan Gay is a 31 y.o. male with a hx of HTN, HLD, obesity who presents for follow-up.  He reports he is having chest discomfort.  Describes sharp pain in his center of his chest.  Symptoms can occur in the morning.  They occurred yesterday.  They were associated with anxiety yesterday.  He reports his diet may have strained a little bit yesterday due to the holiday.  Blood pressure is stable.  EKG is normal.  He does have acid reflux and will undergo an EGD in the next few weeks.  I do agree with this.  His coronary CTA was normal 2 years ago.  Echo was normal.  EKG is reassuring today.  Labs show cholesterol is stable.  He also was started on Wegovy recently.  This could be contributing to his symptoms.  He should monitor this.  Apparently was having chest symptoms before [facility].  Problem List 1. HTN 2. Obesity 3. OSA 4.  Hyperlipidemia -T chol 168, HDL 31, LDL 92, triglycerides 264 5. Calcium score 1 (75th percentile)  Past Medical History: Past Medical History:  Diagnosis Date   Anxiety    Depression    High cholesterol    Hypertension    Seizures (HCC)     Past Surgical History: Past Surgical History:  Procedure Laterality Date   VASECTOMY     WISDOM TOOTH EXTRACTION      Current Medications: Current Meds  Medication Sig   albuterol (VENTOLIN HFA) 108 (90 Base) MCG/ACT inhaler Inhale 1 puff every 4-6 hours as needed   amLODipine (NORVASC) 10 MG tablet TAKE 1 TABLET BY MOUTH ONCE DAILY   atorvastatin (LIPITOR) 40 MG tablet TAKE 1 TABLET BY MOUTH ONCE DAILY   carvedilol (COREG) 25 MG tablet TAKE 1 TABLET BY MOUTH TWICE DAILY WITH MEALS   chlorthalidone  (HYGROTON) 25 MG tablet Take 25 mg by mouth daily.    chlorthalidone (HYGROTON) 25 MG tablet Take 1 tablet (25 mg total) by mouth every morning with food. NEED APPT FOR REFILLS   fenofibrate (TRICOR) 145 MG tablet TAKE 1 TABLET BY MOUTH ONCE A DAY   losartan (COZAAR) 25 MG tablet Take 1 tablet by mouth daily.   losartan (COZAAR) 25 MG tablet TAKE 1 TABLET BY MOUTH ONCE DAILY   pantoprazole (PROTONIX) 40 MG tablet Take 1 tablet by mouth once a day   pantoprazole (PROTONIX) 40 MG tablet Take 1 tablet by mouth once a day   Semaglutide-Weight Management (WEGOVY) 0.25 MG/0.5ML SOAJ Inject 0.5 mL (0.25mg ) subcutaneously weekly     Allergies:    Patient has no known allergies.   Social History: Social History   Socioeconomic History   Marital status: Married    Spouse name: Not on file   Number of children: 2   Years of education: Not on file   Highest education level: Not on file  Occupational History   Occupation: [employer]  Tobacco Use   Smoking status: Former    Years: 12.00    Types: Cigarettes    Quit date: 10/03/2016    Years since quitting: 5.1  Smokeless tobacco: Never  Vaping Use   Vaping Use: Every day   Substances: Nicotine  Substance and Sexual Activity   Alcohol use: Yes    Comment: 4 drinks per wk   Drug use: No   Sexual activity: Yes    Partners: Female  Other Topics Concern   Not on file  Social History Narrative   Lives at home w/ wife and kids   Right-handed   Caffeine: 1 Red Bull daily   Social Determinants of Health   Financial Resource Strain: Not on file  Food Insecurity: Not on file  Transportation Needs: Not on file  Physical Activity: Not on file  Stress: Not on file  Social Connections: Not on file     Family History: The patient's family history includes Heart disease in his father; Hypertension in his father and mother; Hypothyroidism in his mother; Liver cancer in his maternal grandfather.  ROS:   All other ROS reviewed and  negative. Pertinent positives noted in the HPI.    EKGs/Labs/Other Studies Reviewed:   The following studies were personally reviewed by me today:  EKG:  EKG is ordered today.  The ekg ordered today demonstrates normal sinus rhythm heart rate 75, no acute ischemic changes or evidence of infarction, and was personally reviewed by me.   Recent Labs: No results found for requested labs within last 365 days.   Recent Lipid Panel    Component Value Date/Time   CHOL 154 09/25/2020 0931   TRIG 186 (H) 09/25/2020 0931   HDL 31 (L) 09/25/2020 0931   CHOLHDL 5.0 09/25/2020 0931   CHOLHDL 6.1 07/05/2019 0528   VLDL UNABLE TO CALCULATE IF TRIGLYCERIDE OVER 400 mg/dL 97/67/3419 3790   LDLCALC 91 09/25/2020 0931   LDLDIRECT 88 11/09/2019 0842   LDLDIRECT 64.2 07/05/2019 0528    Physical Exam:   VS:  BP 132/80   Pulse 75   Ht 6\' 1"  (1.854 m)   Wt 272 lb 6.4 oz (123.6 kg)   SpO2 97%   BMI 35.94 kg/m    Wt Readings from Last 3 Encounters:  11/12/21 272 lb 6.4 oz (123.6 kg)  11/04/20 278 lb 9.6 oz (126.4 kg)  06/04/20 294 lb 3.2 oz (133.4 kg)    General: Well nourished, well developed, in no acute distress Head: Atraumatic, normal size  Eyes: PEERLA, EOMI  Neck: Supple, no JVD Endocrine: No thryomegaly Cardiac: Normal S1, S2; RRR; no murmurs, rubs, or gallops Lungs: Clear to auscultation bilaterally, no wheezing, rhonchi or rales  Abd: Soft, nontender, no hepatomegaly  Ext: No edema, pulses 2+ Musculoskeletal: No deformities, BUE and BLE strength normal and equal Skin: Warm and dry, no rashes   Neuro: Alert and oriented to person, place, time, and situation, CNII-XII grossly intact, no focal deficits  Psych: Normal mood and affect   ASSESSMENT:   Ryan Gay is a 31 y.o. male who presents for the following: 1. Precordial pain   2. Mixed hyperlipidemia   3. Primary hypertension   4. Obesity (BMI 30-39.9)     PLAN:   1. Precordial pain -Noncardiac chest pain.  Likely acid  reflux related.  Coronary CTA in 2021 normal.  Echo normal.  EKG is nonischemic.  Symptoms can be associated with anxiety and stress.  Overall noncardiac chest pain.  Would proceed with EGD as well as stress reduction.  2. Mixed hyperlipidemia -Elevated triglycerides.  Continue with weight loss and exercise.  On atorvastatin.  Also on fenofibrate.  3. Primary hypertension -  Well-controlled.  No change to medications.  4. Obesity (BMI 30-39.9) -Diet and exercise recommended.  Disposition: Return in about 1 year (around 11/13/2022).  Medication Adjustments/Labs and Tests Ordered: Current medicines are reviewed at length with the patient today.  Concerns regarding medicines are outlined above.  Orders Placed This Encounter  Procedures   EKG 12-Lead   No orders of the defined types were placed in this encounter.   Patient Instructions  Medication Instructions:  The current medical regimen is effective;  continue present plan and medications.  *If you need a refill on your cardiac medications before your next appointment, please call your pharmacy*   Follow-Up: At [facility], you and your health needs are our priority.  As part of our continuing mission to provide you with exceptional heart care, we have created designated Provider Care Teams.  These Care Teams include your primary Cardiologist (physician) and Advanced Practice Providers (APPs -  Physician Assistants and Nurse Practitioners) who all work together to provide you with the care you need, when you need it.  We recommend signing up for the patient portal called "MyChart".  Sign up information is provided on this After Visit Summary.  MyChart is used to connect with patients for Virtual Visits (Telemedicine).  Patients are able to view lab/test results, encounter notes, upcoming appointments, etc.  Non-urgent messages can be sent to your provider as well.   To learn more about what you can do with MyChart, go to  [email or web address].    Your next appointment:   12 month(s)  The format for your next appointment:   In Person  Provider:   Lennie Odor, MD           Time Spent with Patient: I have spent a total of 25 minutes with patient reviewing hospital notes, telemetry, EKGs, labs and examining the patient as well as establishing an assessment and plan that was discussed with the patient.  > 50% of time was spent in direct patient care.  Signed, Lenna Gilford. Flora Lipps, MD, [facility]  [facility]  [address] [place], [place] [address] [phone]  11/12/2021 9:25 AM

## 2021-11-12 ENCOUNTER — Encounter (HOSPITAL_BASED_OUTPATIENT_CLINIC_OR_DEPARTMENT_OTHER): Payer: Self-pay | Admitting: Cardiovascular Disease

## 2021-11-12 ENCOUNTER — Ambulatory Visit (INDEPENDENT_AMBULATORY_CARE_PROVIDER_SITE_OTHER): Payer: No Typology Code available for payment source | Admitting: Cardiovascular Disease

## 2021-11-12 ENCOUNTER — Other Ambulatory Visit (HOSPITAL_COMMUNITY): Payer: Self-pay

## 2021-11-12 VITALS — BP 132/80 | HR 75 | Ht 73.0 in | Wt 272.4 lb

## 2021-11-12 DIAGNOSIS — E782 Mixed hyperlipidemia: Secondary | ICD-10-CM | POA: Diagnosis not present

## 2021-11-12 DIAGNOSIS — E669 Obesity, unspecified: Secondary | ICD-10-CM | POA: Diagnosis not present

## 2021-11-12 DIAGNOSIS — I10 Essential (primary) hypertension: Secondary | ICD-10-CM

## 2021-11-12 DIAGNOSIS — R072 Precordial pain: Secondary | ICD-10-CM | POA: Diagnosis not present

## 2021-11-12 NOTE — Patient Instructions (Signed)

## 2021-11-13 ENCOUNTER — Other Ambulatory Visit (HOSPITAL_COMMUNITY): Payer: Self-pay

## 2021-11-13 MED ORDER — WEGOVY 0.5 MG/0.5ML ~~LOC~~ SOAJ
SUBCUTANEOUS | 0 refills | Status: DC
  Filled 2021-11-13: qty 2, 28d supply, fill #0

## 2021-11-25 ENCOUNTER — Other Ambulatory Visit: Payer: Self-pay | Admitting: Cardiovascular Disease

## 2021-11-26 ENCOUNTER — Other Ambulatory Visit (HOSPITAL_COMMUNITY): Payer: Self-pay

## 2021-11-26 MED ORDER — LOSARTAN POTASSIUM 25 MG PO TABS
ORAL_TABLET | Freq: Every day | ORAL | 3 refills | Status: DC
  Filled 2021-11-26: qty 90, 90d supply, fill #0
  Filled 2022-02-18: qty 90, 90d supply, fill #1
  Filled 2022-05-28: qty 90, 90d supply, fill #2
  Filled 2022-08-25: qty 90, 90d supply, fill #3

## 2021-12-08 ENCOUNTER — Other Ambulatory Visit: Payer: Self-pay | Admitting: Cardiovascular Disease

## 2021-12-09 ENCOUNTER — Other Ambulatory Visit (HOSPITAL_COMMUNITY): Payer: Self-pay

## 2021-12-09 MED ORDER — ATORVASTATIN CALCIUM 40 MG PO TABS
ORAL_TABLET | Freq: Every day | ORAL | 3 refills | Status: DC
  Filled 2021-12-09: qty 90, 90d supply, fill #0
  Filled 2022-03-06: qty 90, 90d supply, fill #1
  Filled 2022-06-04: qty 90, 90d supply, fill #2
  Filled 2022-08-25: qty 90, 90d supply, fill #3

## 2021-12-09 MED ORDER — FENOFIBRATE 145 MG PO TABS
ORAL_TABLET | Freq: Every day | ORAL | 1 refills | Status: DC
  Filled 2021-12-09: qty 90, 90d supply, fill #0
  Filled 2022-03-06: qty 90, 90d supply, fill #1

## 2021-12-10 ENCOUNTER — Other Ambulatory Visit (HOSPITAL_COMMUNITY): Payer: Self-pay

## 2021-12-10 MED ORDER — WEGOVY 0.5 MG/0.5ML ~~LOC~~ SOAJ
SUBCUTANEOUS | 0 refills | Status: DC
  Filled 2021-12-10 – 2021-12-17 (×2): qty 2, 28d supply, fill #0

## 2021-12-17 ENCOUNTER — Other Ambulatory Visit (HOSPITAL_COMMUNITY): Payer: Self-pay

## 2021-12-17 MED ORDER — WEGOVY 1 MG/0.5ML ~~LOC~~ SOAJ
1.0000 mg | SUBCUTANEOUS | 0 refills | Status: DC
  Filled 2021-12-17: qty 2, 28d supply, fill #0

## 2021-12-18 ENCOUNTER — Other Ambulatory Visit (HOSPITAL_COMMUNITY): Payer: Self-pay

## 2021-12-20 ENCOUNTER — Other Ambulatory Visit (HOSPITAL_COMMUNITY): Payer: Self-pay

## 2021-12-30 ENCOUNTER — Other Ambulatory Visit: Payer: Self-pay | Admitting: Cardiovascular Disease

## 2021-12-30 ENCOUNTER — Other Ambulatory Visit (HOSPITAL_COMMUNITY): Payer: Self-pay

## 2021-12-30 MED ORDER — WEGOVY 1.7 MG/0.75ML ~~LOC~~ SOAJ
SUBCUTANEOUS | 0 refills | Status: DC
  Filled 2021-12-30: qty 3, 28d supply, fill #0

## 2021-12-31 ENCOUNTER — Other Ambulatory Visit (HOSPITAL_COMMUNITY): Payer: Self-pay

## 2021-12-31 MED ORDER — AMLODIPINE BESYLATE 10 MG PO TABS
ORAL_TABLET | Freq: Every day | ORAL | 3 refills | Status: DC
  Filled 2021-12-31: qty 90, 90d supply, fill #0
  Filled 2022-04-08: qty 90, 90d supply, fill #1
  Filled 2022-07-04: qty 90, 90d supply, fill #2
  Filled 2022-10-11: qty 90, 90d supply, fill #3

## 2022-01-20 ENCOUNTER — Other Ambulatory Visit (HOSPITAL_COMMUNITY): Payer: Self-pay

## 2022-01-20 MED ORDER — LIDOCAINE VISCOUS HCL 2 % MT SOLN
OROMUCOSAL | 2 refills | Status: AC
  Filled 2022-01-20: qty 120, 6d supply, fill #0

## 2022-01-24 ENCOUNTER — Other Ambulatory Visit (HOSPITAL_COMMUNITY): Payer: Self-pay

## 2022-01-29 ENCOUNTER — Other Ambulatory Visit (HOSPITAL_COMMUNITY): Payer: Self-pay

## 2022-01-30 ENCOUNTER — Other Ambulatory Visit (HOSPITAL_COMMUNITY): Payer: Self-pay

## 2022-02-06 ENCOUNTER — Other Ambulatory Visit: Payer: Self-pay | Admitting: Cardiovascular Disease

## 2022-02-09 ENCOUNTER — Other Ambulatory Visit (HOSPITAL_COMMUNITY): Payer: Self-pay

## 2022-02-09 MED ORDER — CARVEDILOL 25 MG PO TABS
ORAL_TABLET | Freq: Two times a day (BID) | ORAL | 3 refills | Status: DC
  Filled 2022-02-09: qty 180, 90d supply, fill #0
  Filled 2022-05-06: qty 180, 90d supply, fill #1
  Filled 2022-08-06: qty 180, 90d supply, fill #2
  Filled 2022-11-09: qty 180, 90d supply, fill #3

## 2022-02-11 ENCOUNTER — Other Ambulatory Visit (HOSPITAL_COMMUNITY): Payer: Self-pay

## 2022-02-11 MED ORDER — WEGOVY 2.4 MG/0.75ML ~~LOC~~ SOAJ
2.4000 mg | SUBCUTANEOUS | 3 refills | Status: DC
  Filled 2022-02-11: qty 3, 28d supply, fill #0
  Filled 2022-03-06: qty 3, 28d supply, fill #1
  Filled 2022-04-08: qty 3, 28d supply, fill #2
  Filled 2022-05-01: qty 3, 28d supply, fill #3
  Filled 2022-07-09: qty 3, 28d supply, fill #4
  Filled 2022-08-25: qty 3, 28d supply, fill #5
  Filled 2022-10-11: qty 3, 28d supply, fill #6

## 2022-02-18 ENCOUNTER — Other Ambulatory Visit (HOSPITAL_COMMUNITY): Payer: Self-pay

## 2022-02-18 MED ORDER — CHLORTHALIDONE 25 MG PO TABS
25.0000 mg | ORAL_TABLET | Freq: Every morning | ORAL | 2 refills | Status: DC
Start: 2022-02-18 — End: 2022-11-18
  Filled 2022-02-18: qty 90, 90d supply, fill #0
  Filled 2022-05-28: qty 90, 90d supply, fill #1
  Filled 2022-08-25: qty 90, 90d supply, fill #2

## 2022-02-23 ENCOUNTER — Other Ambulatory Visit (HOSPITAL_COMMUNITY): Payer: Self-pay

## 2022-03-06 ENCOUNTER — Other Ambulatory Visit (HOSPITAL_COMMUNITY): Payer: Self-pay

## 2022-03-20 ENCOUNTER — Other Ambulatory Visit (HOSPITAL_COMMUNITY): Payer: Self-pay

## 2022-04-09 ENCOUNTER — Other Ambulatory Visit (HOSPITAL_COMMUNITY): Payer: Self-pay

## 2022-05-05 ENCOUNTER — Other Ambulatory Visit: Payer: Self-pay

## 2022-05-06 ENCOUNTER — Other Ambulatory Visit: Payer: Self-pay

## 2022-05-07 ENCOUNTER — Other Ambulatory Visit (HOSPITAL_COMMUNITY): Payer: Self-pay

## 2022-05-13 ENCOUNTER — Other Ambulatory Visit (HOSPITAL_COMMUNITY): Payer: Self-pay

## 2022-05-13 MED ORDER — WEGOVY 2.4 MG/0.75ML ~~LOC~~ SOAJ
2.4000 mg | SUBCUTANEOUS | 0 refills | Status: DC
  Filled 2022-05-13 – 2022-06-14 (×2): qty 3, 28d supply, fill #0
  Filled 2022-07-28 – 2022-08-01 (×2): qty 3, 28d supply, fill #1
  Filled 2022-09-30: qty 3, 28d supply, fill #2

## 2022-05-14 ENCOUNTER — Other Ambulatory Visit (HOSPITAL_COMMUNITY): Payer: Self-pay

## 2022-05-15 ENCOUNTER — Other Ambulatory Visit (HOSPITAL_COMMUNITY): Payer: Self-pay

## 2022-05-20 ENCOUNTER — Other Ambulatory Visit (HOSPITAL_COMMUNITY): Payer: Self-pay

## 2022-06-04 ENCOUNTER — Other Ambulatory Visit: Payer: Self-pay | Admitting: Cardiovascular Disease

## 2022-06-04 ENCOUNTER — Other Ambulatory Visit: Payer: Self-pay

## 2022-06-08 ENCOUNTER — Other Ambulatory Visit (HOSPITAL_COMMUNITY): Payer: Self-pay

## 2022-06-08 MED ORDER — FENOFIBRATE 145 MG PO TABS
145.0000 mg | ORAL_TABLET | Freq: Every day | ORAL | 3 refills | Status: DC
  Filled 2022-06-08: qty 90, 90d supply, fill #0
  Filled 2022-08-25: qty 90, 90d supply, fill #1

## 2022-06-15 ENCOUNTER — Other Ambulatory Visit (HOSPITAL_COMMUNITY): Payer: Self-pay

## 2022-07-09 DIAGNOSIS — F1021 Alcohol dependence, in remission: Secondary | ICD-10-CM | POA: Diagnosis not present

## 2022-07-09 DIAGNOSIS — I10 Essential (primary) hypertension: Secondary | ICD-10-CM | POA: Diagnosis not present

## 2022-07-09 DIAGNOSIS — E7800 Pure hypercholesterolemia, unspecified: Secondary | ICD-10-CM | POA: Diagnosis not present

## 2022-07-09 DIAGNOSIS — R7303 Prediabetes: Secondary | ICD-10-CM | POA: Diagnosis not present

## 2022-07-09 DIAGNOSIS — K219 Gastro-esophageal reflux disease without esophagitis: Secondary | ICD-10-CM | POA: Diagnosis not present

## 2022-07-09 DIAGNOSIS — Z0000 Encounter for general adult medical examination without abnormal findings: Secondary | ICD-10-CM | POA: Diagnosis not present

## 2022-07-22 DIAGNOSIS — G4733 Obstructive sleep apnea (adult) (pediatric): Secondary | ICD-10-CM | POA: Diagnosis not present

## 2022-07-31 ENCOUNTER — Other Ambulatory Visit: Payer: Self-pay

## 2022-08-22 DIAGNOSIS — G4733 Obstructive sleep apnea (adult) (pediatric): Secondary | ICD-10-CM | POA: Diagnosis not present

## 2022-08-26 DIAGNOSIS — G4733 Obstructive sleep apnea (adult) (pediatric): Secondary | ICD-10-CM | POA: Diagnosis not present

## 2022-09-03 DIAGNOSIS — G4710 Hypersomnia, unspecified: Secondary | ICD-10-CM | POA: Diagnosis not present

## 2022-09-20 ENCOUNTER — Other Ambulatory Visit (HOSPITAL_COMMUNITY): Payer: Self-pay

## 2022-09-21 DIAGNOSIS — G4733 Obstructive sleep apnea (adult) (pediatric): Secondary | ICD-10-CM | POA: Diagnosis not present

## 2022-09-22 ENCOUNTER — Other Ambulatory Visit (HOSPITAL_COMMUNITY): Payer: Self-pay

## 2022-09-22 MED ORDER — PANTOPRAZOLE SODIUM 40 MG PO TBEC
40.0000 mg | DELAYED_RELEASE_TABLET | Freq: Every day | ORAL | 1 refills | Status: DC
  Filled 2022-09-22: qty 90, 90d supply, fill #0
  Filled 2023-03-27: qty 90, 90d supply, fill #1

## 2022-09-30 ENCOUNTER — Other Ambulatory Visit (HOSPITAL_COMMUNITY): Payer: Self-pay

## 2022-10-11 ENCOUNTER — Other Ambulatory Visit (HOSPITAL_COMMUNITY): Payer: Self-pay

## 2022-10-12 ENCOUNTER — Other Ambulatory Visit (HOSPITAL_COMMUNITY): Payer: Self-pay

## 2022-10-12 ENCOUNTER — Other Ambulatory Visit: Payer: Self-pay

## 2022-10-13 ENCOUNTER — Other Ambulatory Visit (HOSPITAL_COMMUNITY): Payer: Self-pay

## 2022-10-13 MED ORDER — PANTOPRAZOLE SODIUM 40 MG PO TBEC
40.0000 mg | DELAYED_RELEASE_TABLET | Freq: Every day | ORAL | 1 refills | Status: DC
  Filled 2022-12-22: qty 90, 90d supply, fill #0
  Filled 2023-06-25: qty 90, 90d supply, fill #1

## 2022-11-09 NOTE — Progress Notes (Signed)
Cardiology Office Note:   Date:  11/18/2022  NAME:  Ryan Gay    MRN: [id number] DOB:  [birth year]   PCP:  Soundra Pilon, FNP  Cardiologist:  None  Electrophysiologist:  None   Referring MD: Soundra Pilon, FNP   Chief Complaint  Patient presents with   Follow-up         History of Present Illness:   Ryan Gay is a 32 y.o. male with a hx of CAC score of 1, HTN, HLD who presents for follow-up.  He reports he has lost nearly 80 pounds.  He started on Wegovy.  Has done well.  Lipids are very well-controlled.  Blood pressure is actually a bit low.  He does report low energy and fatigue.  Suspect he needs to come off some of his medications.  No symptoms of chest pains or trouble breathing.  Overall doing well.  He also reports he has come off his CPAP machine.  He no longer has sleep apnea.  He has done remarkably well with weight loss.  Problem List 1. HTN 2. Obesity 3. OSA -cured with weight loss  4.  Hyperlipidemia -T chol 164, HDL 44, LDL 98, TG 129 5. Calcium score 1 (75th percentile)  Past Medical History: Past Medical History:  Diagnosis Date   Anxiety    Depression    High cholesterol    Hypertension    Seizures (HCC)     Past Surgical History: Past Surgical History:  Procedure Laterality Date   VASECTOMY     WISDOM TOOTH EXTRACTION      Current Medications: Current Meds  Medication Sig   albuterol (VENTOLIN HFA) 108 (90 Base) MCG/ACT inhaler Inhale 1 puff every 4-6 hours as needed   atorvastatin (LIPITOR) 40 MG tablet TAKE 1 TABLET BY MOUTH ONCE DAILY   carvedilol (COREG) 25 MG tablet TAKE 1 TABLET BY MOUTH TWICE DAILY WITH MEALS   fluticasone (FLOVENT HFA) 110 MCG/ACT inhaler Inhale 1 puff into the lungs twice a day   losartan (COZAAR) 25 MG tablet Take 1 tablet by mouth daily.   magic mouthwash (lidocaine, diphenhydrAMINE, alum & mag hydroxide) suspension Swish and spit 5 mls every 6 hours as needed   pantoprazole (PROTONIX) 40 MG tablet Take 1  tablet (40 mg total) by mouth daily.   Semaglutide-Weight Management (WEGOVY) 2.4 MG/0.75ML SOAJ Inject 2.4 mg into the skin once a week.   [DISCONTINUED] amLODipine (NORVASC) 10 MG tablet TAKE 1 TABLET BY MOUTH ONCE DAILY   [DISCONTINUED] chlorthalidone (HYGROTON) 25 MG tablet Take 25 mg by mouth daily.    [DISCONTINUED] chlorthalidone (HYGROTON) 25 MG tablet Take 1 tablet (25 mg total) by mouth every morning. Take with food.   [DISCONTINUED] fenofibrate (TRICOR) 145 MG tablet Take 1 tablet (145 mg total) by mouth daily.     Allergies:    Patient has no known allergies.   Social History: Social History   Socioeconomic History   Marital status: Married    Spouse name: Not on file   Number of children: 2   Years of education: Not on file   Highest education level: Not on file  Occupational History   Occupation: [employer]  Tobacco Use   Smoking status: Former    Years: 12    Types: Cigarettes    Quit date: 10/03/2016    Years since quitting: 6.1   Smokeless tobacco: Never  Vaping Use   Vaping Use: Every day   Substances: Nicotine  Substance and  Sexual Activity   Alcohol use: Yes    Comment: 4 drinks per wk   Drug use: No   Sexual activity: Yes    Partners: Female  Other Topics Concern   Not on file  Social History Narrative   Lives at home w/ wife and kids   Right-handed   Caffeine: 1 Red Bull daily   Social Determinants of Health   Financial Resource Strain: Not on file  Food Insecurity: Not on file  Transportation Needs: Not on file  Physical Activity: Not on file  Stress: Not on file  Social Connections: Not on file     Family History: The patient's family history includes Heart disease in his father; Hypertension in his father and mother; Hypothyroidism in his mother; Liver cancer in his maternal grandfather.  ROS:   All other ROS reviewed and negative. Pertinent positives noted in the HPI.     EKGs/Labs/Other Studies Reviewed:   The following  studies were personally reviewed by me today:  EKG:  EKG is not ordered today.        Recent Labs: No results found for requested labs within last 365 days.   Recent Lipid Panel    Component Value Date/Time   CHOL 154 09/25/2020 0931   TRIG 186 (H) 09/25/2020 0931   HDL 31 (L) 09/25/2020 0931   CHOLHDL 5.0 09/25/2020 0931   CHOLHDL 6.1 07/05/2019 0528   VLDL UNABLE TO CALCULATE IF TRIGLYCERIDE OVER 400 mg/dL 40/98/1191 4782   LDLCALC 91 09/25/2020 0931   LDLDIRECT 88 11/09/2019 0842   LDLDIRECT 64.2 07/05/2019 0528    Physical Exam:   VS:  BP 104/72   Pulse 80   Ht 6\' 1"  (1.854 m)   Wt 226 lb (102.5 kg)   SpO2 97%   BMI 29.82 kg/m    Wt Readings from Last 3 Encounters:  11/18/22 226 lb (102.5 kg)  11/12/21 272 lb 6.4 oz (123.6 kg)  11/04/20 278 lb 9.6 oz (126.4 kg)    General: Well nourished, well developed, in no acute distress Head: Atraumatic, normal size  Eyes: PEERLA, EOMI  Neck: Supple, no JVD Endocrine: No thryomegaly Cardiac: Normal S1, S2; RRR; no murmurs, rubs, or gallops Lungs: Clear to auscultation bilaterally, no wheezing, rhonchi or rales  Abd: Soft, nontender, no hepatomegaly  Ext: No edema, pulses 2+ Musculoskeletal: No deformities, BUE and BLE strength normal and equal Skin: Warm and dry, no rashes   Neuro: Alert and oriented to person, place, time, and situation, CNII-XII grossly intact, no focal deficits  Psych: Normal mood and affect   ASSESSMENT:   Ryan Gay is a 32 y.o. male who presents for the following: 1. Agatston coronary artery calcium score less than 100   2. Mixed hyperlipidemia   3. Primary hypertension     PLAN:   1. Agatston coronary artery calcium score less than 100 2. Mixed hyperlipidemia -Coronary calcium score of 1.  Issue has been triglycerides.  LDL not that bad.  Okay to continue Lipitor 40 mg daily.  I think he can come off fenofibrate.  Given his weight loss this may not be an issue anymore.  We will plan to  have him come back in 2 months and recheck lipids off of fenofibrate to see if he still needs this.  3. Primary hypertension -Blood pressure a bit low.  He is lost 80 pounds.  Stop amlodipine as well as chlorthalidone.  Okay to continue losartan 25 mg daily and carvedilol 25 mg  twice daily.  He may end up coming off these medications as well.  He can work with his primary care physician on this.  I also encouraged him to check his blood pressure at home.  He will let us know if he develops symptoms.      Disposition: Return in about 1 year (around 11/18/2023).  Medication Adjustments/Labs and Tests Ordered: Current medicines are reviewed at length with the patient today.  Concerns regarding medicines are outlined above.  Orders Placed This Encounter  Procedures   Lipid panel   No orders of the defined types were placed in this encounter.  Patient Instructions  Medication Instructions:  STOP amlodipine STOP chlorthalidone STOP fenofibrate CONTINUE other current meds  *If you need a refill on your cardiac medications before your next appointment, please call your pharmacy*   Lab Work: FASTING lab work to check cholesterol in 2 months -- no appointment needed.   If you have labs (blood work) drawn today and your tests are completely normal, you will receive your results only by: MyChart Message (if you have MyChart) OR A paper copy in the mail If you have any lab test that is abnormal or we need to change your treatment, we will call you to review the results.  Follow-Up: At [facility], you and your health needs are our priority.  As part of our continuing mission to provide you with exceptional heart care, we have created designated Provider Care Teams.  These Care Teams include your primary Cardiologist (physician) and Advanced Practice Providers (APPs -  Physician Assistants and Nurse Practitioners) who all work together to provide you with the care you need, when you  need it.  We recommend signing up for the patient portal called "MyChart".  Sign up information is provided on this After Visit Summary.  MyChart is used to connect with patients for Virtual Visits (Telemedicine).  Patients are able to view lab/test results, encounter notes, upcoming appointments, etc.  Non-urgent messages can be sent to your provider as well.   To learn more about what you can do with MyChart, go to [email or web address].    Your next appointment:    12 months with Dr. Flora Lipps    Time Spent with Patient: I have spent a total of 25 minutes with patient reviewing hospital notes, telemetry, EKGs, labs and examining the patient as well as establishing an assessment and plan that was discussed with the patient.  > 50% of time was spent in direct patient care.  Signed, Lenna Gilford. Flora Lipps, MD, [facility]  [facility]  [address] [place], [place] [address] [phone]  11/18/2022 10:47 AM

## 2022-11-11 ENCOUNTER — Other Ambulatory Visit (HOSPITAL_COMMUNITY): Payer: Self-pay

## 2022-11-18 ENCOUNTER — Encounter: Payer: Self-pay | Admitting: Cardiovascular Disease

## 2022-11-18 ENCOUNTER — Ambulatory Visit: Payer: 59 | Attending: Cardiovascular Disease | Admitting: Cardiovascular Disease

## 2022-11-18 VITALS — BP 104/72 | HR 80 | Ht 73.0 in | Wt 226.0 lb

## 2022-11-18 DIAGNOSIS — R931 Abnormal findings on diagnostic imaging of heart and coronary circulation: Secondary | ICD-10-CM

## 2022-11-18 DIAGNOSIS — E782 Mixed hyperlipidemia: Secondary | ICD-10-CM

## 2022-11-18 DIAGNOSIS — I10 Essential (primary) hypertension: Secondary | ICD-10-CM | POA: Diagnosis not present

## 2022-11-18 NOTE — Patient Instructions (Signed)
Medication Instructions:  STOP amlodipine STOP chlorthalidone STOP fenofibrate CONTINUE other current meds  *If you need a refill on your cardiac medications before your next appointment, please call your pharmacy*   Lab Work: FASTING lab work to check cholesterol in 2 months -- no appointment needed.   If you have labs (blood work) drawn today and your tests are completely normal, you will receive your results only by: MyChart Message (if you have MyChart) OR A paper copy in the mail If you have any lab test that is abnormal or we need to change your treatment, we will call you to review the results.  Follow-Up: At [facility], you and your health needs are our priority.  As part of our continuing mission to provide you with exceptional heart care, we have created designated Provider Care Teams.  These Care Teams include your primary Cardiologist (physician) and Advanced Practice Providers (APPs -  Physician Assistants and Nurse Practitioners) who all work together to provide you with the care you need, when you need it.  We recommend signing up for the patient portal called "MyChart".  Sign up information is provided on this After Visit Summary.  MyChart is used to connect with patients for Virtual Visits (Telemedicine).  Patients are able to view lab/test results, encounter notes, upcoming appointments, etc.  Non-urgent messages can be sent to your provider as well.   To learn more about what you can do with MyChart, go to [email or web address].    Your next appointment:    12 months with Dr. Flora Lipps

## 2022-11-29 ENCOUNTER — Other Ambulatory Visit: Payer: Self-pay | Admitting: Cardiovascular Disease

## 2022-11-30 ENCOUNTER — Other Ambulatory Visit (HOSPITAL_COMMUNITY): Payer: Self-pay

## 2022-11-30 ENCOUNTER — Other Ambulatory Visit: Payer: Self-pay | Admitting: Cardiovascular Disease

## 2022-11-30 MED ORDER — ATORVASTATIN CALCIUM 40 MG PO TABS
ORAL_TABLET | Freq: Every day | ORAL | 3 refills | Status: DC
  Filled 2022-11-30: qty 90, 90d supply, fill #0
  Filled 2023-03-13: qty 90, 90d supply, fill #1
  Filled 2023-06-15: qty 90, 90d supply, fill #2
  Filled 2023-09-16: qty 90, 90d supply, fill #3

## 2022-12-01 ENCOUNTER — Other Ambulatory Visit (HOSPITAL_COMMUNITY): Payer: Self-pay

## 2022-12-01 ENCOUNTER — Encounter: Payer: Self-pay | Admitting: Cardiovascular Disease

## 2022-12-01 ENCOUNTER — Other Ambulatory Visit: Payer: Self-pay | Admitting: *Deleted

## 2022-12-01 MED ORDER — LOSARTAN POTASSIUM 25 MG PO TABS
25.0000 mg | ORAL_TABLET | Freq: Every day | ORAL | 11 refills | Status: DC
  Filled 2022-12-01: qty 30, 30d supply, fill #0

## 2022-12-22 ENCOUNTER — Other Ambulatory Visit (HOSPITAL_COMMUNITY): Payer: Self-pay

## 2022-12-27 ENCOUNTER — Encounter: Payer: Self-pay | Admitting: Cardiovascular Disease

## 2022-12-28 ENCOUNTER — Other Ambulatory Visit (HOSPITAL_COMMUNITY): Payer: Self-pay

## 2022-12-28 MED ORDER — LOSARTAN POTASSIUM 25 MG PO TABS
25.0000 mg | ORAL_TABLET | Freq: Every day | ORAL | 3 refills | Status: DC
  Filled 2022-12-28: qty 90, 90d supply, fill #0
  Filled 2023-03-27: qty 90, 90d supply, fill #1
  Filled 2023-06-25: qty 90, 90d supply, fill #2

## 2023-01-12 ENCOUNTER — Other Ambulatory Visit (HOSPITAL_COMMUNITY): Payer: Self-pay

## 2023-01-12 DIAGNOSIS — I10 Essential (primary) hypertension: Secondary | ICD-10-CM | POA: Diagnosis not present

## 2023-01-12 DIAGNOSIS — E7800 Pure hypercholesterolemia, unspecified: Secondary | ICD-10-CM | POA: Diagnosis not present

## 2023-01-12 DIAGNOSIS — F1021 Alcohol dependence, in remission: Secondary | ICD-10-CM | POA: Diagnosis not present

## 2023-01-12 DIAGNOSIS — R110 Nausea: Secondary | ICD-10-CM | POA: Diagnosis not present

## 2023-01-12 DIAGNOSIS — R7303 Prediabetes: Secondary | ICD-10-CM | POA: Diagnosis not present

## 2023-01-12 DIAGNOSIS — E669 Obesity, unspecified: Secondary | ICD-10-CM | POA: Diagnosis not present

## 2023-01-12 MED ORDER — ONDANSETRON HCL 4 MG PO TABS
4.0000 mg | ORAL_TABLET | Freq: Two times a day (BID) | ORAL | 0 refills | Status: AC | PRN
  Filled 2023-01-12: qty 40, 10d supply, fill #0

## 2023-01-23 ENCOUNTER — Other Ambulatory Visit (HOSPITAL_COMMUNITY): Payer: Self-pay

## 2023-01-28 ENCOUNTER — Other Ambulatory Visit: Payer: Self-pay | Admitting: Cardiovascular Disease

## 2023-01-28 DIAGNOSIS — E782 Mixed hyperlipidemia: Secondary | ICD-10-CM | POA: Diagnosis not present

## 2023-01-29 LAB — LIPID PANEL
Chol/HDL Ratio: 2.7 ratio
Cholesterol, Total: 150 mg/dL
HDL: 56 mg/dL (ref >39)
LDL Chol Calc (NIH): 71 mg/dL
Triglycerides: 133 mg/dL
VLDL Cholesterol Cal: 23 mg/dL (ref 5–40)

## 2023-02-01 LAB — LIPID PANEL

## 2023-02-06 ENCOUNTER — Other Ambulatory Visit: Payer: Self-pay | Admitting: Cardiovascular Disease

## 2023-02-08 ENCOUNTER — Other Ambulatory Visit (HOSPITAL_COMMUNITY): Payer: Self-pay

## 2023-02-09 ENCOUNTER — Other Ambulatory Visit (HOSPITAL_COMMUNITY): Payer: Self-pay

## 2023-02-09 MED ORDER — CARVEDILOL 25 MG PO TABS
25.0000 mg | ORAL_TABLET | Freq: Two times a day (BID) | ORAL | 2 refills | Status: DC
  Filled 2023-02-09: qty 180, 90d supply, fill #0
  Filled 2023-05-09: qty 180, 90d supply, fill #1
  Filled 2023-08-09: qty 180, 90d supply, fill #2

## 2023-03-16 ENCOUNTER — Other Ambulatory Visit (HOSPITAL_COMMUNITY): Payer: Self-pay

## 2023-03-29 ENCOUNTER — Other Ambulatory Visit (HOSPITAL_COMMUNITY): Payer: Self-pay

## 2023-05-13 ENCOUNTER — Other Ambulatory Visit (HOSPITAL_COMMUNITY): Payer: Self-pay

## 2023-06-16 ENCOUNTER — Other Ambulatory Visit: Payer: Self-pay

## 2023-06-25 ENCOUNTER — Other Ambulatory Visit (HOSPITAL_COMMUNITY): Payer: Self-pay

## 2023-08-03 ENCOUNTER — Other Ambulatory Visit (HOSPITAL_COMMUNITY): Payer: Self-pay

## 2023-08-03 DIAGNOSIS — E7800 Pure hypercholesterolemia, unspecified: Secondary | ICD-10-CM | POA: Diagnosis not present

## 2023-08-03 DIAGNOSIS — R7303 Prediabetes: Secondary | ICD-10-CM | POA: Diagnosis not present

## 2023-08-03 DIAGNOSIS — Z0000 Encounter for general adult medical examination without abnormal findings: Secondary | ICD-10-CM | POA: Diagnosis not present

## 2023-08-03 DIAGNOSIS — I10 Essential (primary) hypertension: Secondary | ICD-10-CM | POA: Diagnosis not present

## 2023-08-03 DIAGNOSIS — Z6832 Body mass index (BMI) 32.0-32.9, adult: Secondary | ICD-10-CM | POA: Diagnosis not present

## 2023-08-03 DIAGNOSIS — K219 Gastro-esophageal reflux disease without esophagitis: Secondary | ICD-10-CM | POA: Diagnosis not present

## 2023-08-03 MED ORDER — PANTOPRAZOLE SODIUM 40 MG PO TBEC
40.0000 mg | DELAYED_RELEASE_TABLET | Freq: Every day | ORAL | 3 refills | Status: AC
  Filled 2023-08-03 – 2023-09-24 (×2): qty 90, 90d supply, fill #0
  Filled 2023-12-25: qty 90, 90d supply, fill #1
  Filled 2024-03-21: qty 90, 90d supply, fill #2

## 2023-08-03 MED ORDER — LOSARTAN POTASSIUM 100 MG PO TABS
100.0000 mg | ORAL_TABLET | Freq: Every day | ORAL | 3 refills | Status: AC
  Filled 2023-08-03: qty 90, 90d supply, fill #0
  Filled 2023-10-25: qty 90, 90d supply, fill #1
  Filled 2024-01-26: qty 90, 90d supply, fill #2
  Filled 2024-04-29: qty 90, 90d supply, fill #3

## 2023-08-03 MED ORDER — MINOXIDIL 10 MG PO TABS
5.0000 mg | ORAL_TABLET | Freq: Every day | ORAL | 0 refills | Status: DC
  Filled 2023-08-03: qty 30, 30d supply, fill #0

## 2023-08-04 ENCOUNTER — Telehealth: Payer: Self-pay | Admitting: Cardiovascular Disease

## 2023-08-04 NOTE — Telephone Encounter (Signed)
 Spoke to patient he stated he is concerned about elevated B/P.Readings listed below.Pulse 70 to 80.Stated he saw PCP yesterday and he increased Losartan to 100 mg daily and he added Minoxidil 10 mg take 1/2 tablet daily.Advised has not been long enough to notice a difference in B/P.Advised to avoid salt.Stated he would like appointment with Dr.O'Neal.Appointment scheduled with Dr.O'Neal 4/10 at 9:20 am.Advised to monitor B/P daily and bring readings to appointment.Advised to call sooner if needed.I will make Dr.O'Neal aware.

## 2023-08-04 NOTE — Telephone Encounter (Signed)
 Pt c/o BP issue: STAT if pt c/o blurred vision, one-sided weakness or slurred speech.  STAT if BP is GREATER than 180/120 TODAY.  STAT if BP is LESS than 90/60 and SYMPTOMATIC TODAY  1. What is your BP concern? Even afetr med adjustments, still no improvement in BP  2. Have you taken any BP medication today? yes  3. What are your last 5 BP readings?178/111, 173/114, 168/119, 158/110, 167/109  4. Are you having any other symptoms (ex. Dizziness, headache, blurred vision, passed out)? Anxiety

## 2023-08-18 NOTE — Progress Notes (Unsigned)
  Cardiology Office Note:  .   Date:  08/18/2023  ID:  Ryan Gay, DOB [birth year], MRN [id number] PCP: Soundra Pilon, FNP  [facility] Providers Cardiologist:  None { Click to update primary MD,subspecialty MD or APP then REFRESH:1}   History of Present Illness: .   No chief complaint on file.   Ryan Gay is a 33 y.o. male with history of CAC, HLD, HTN who presents for follow-up.      Problem List 1. HTN 2. Obesity 3. OSA -cured with weight loss  4.  Hyperlipidemia -T chol 164, HDL 44, LDL 98, TG 129 5. Calcium score 1 (75th percentile)    ROS: All other ROS reviewed and negative. Pertinent positives noted in the HPI.     Studies Reviewed: Marland Kitchen       CCTA 09/08/2019 IMPRESSION: 1. No evidence of CAD, CADRADS = 0.   2. Coronary calcium score of 1. This was 25th percentile for age and sex matched control.   3. Normal coronary origin with right dominance. Physical Exam:   VS:  There were no vitals taken for this visit.   Wt Readings from Last 3 Encounters:  11/18/22 226 lb (102.5 kg)  11/12/21 272 lb 6.4 oz (123.6 kg)  11/04/20 278 lb 9.6 oz (126.4 kg)    GEN: Well nourished, well developed in no acute distress NECK: No JVD; No carotid bruits CARDIAC: ***RRR, no murmurs, rubs, gallops RESPIRATORY:  Clear to auscultation without rales, wheezing or rhonchi  ABDOMEN: Soft, non-tender, non-distended EXTREMITIES:  No edema; No deformity  ASSESSMENT AND PLAN: .   ***    {Are you ordering a CV Procedure (e.g. stress test, cath, DCCV, TEE, etc)?   Press F2        :[id number]}   Follow-up: No follow-ups on file.  Time Spent with Patient: I have spent a total of *** minutes caring for this patient today face to face, ordering and reviewing labs/tests, reviewing prior records/medical history, examining the patient, establishing an assessment and plan, communicating results/findings to the patient/family, and documenting in the medical record.   Signed, Lenna Gilford.  Flora Lipps, MD, [facility]  [facility]  [address] [place], [place] [address] [phone]  9:06 AM

## 2023-08-19 ENCOUNTER — Encounter: Payer: Self-pay | Admitting: Cardiovascular Disease

## 2023-08-19 ENCOUNTER — Other Ambulatory Visit (HOSPITAL_COMMUNITY): Payer: Self-pay

## 2023-08-19 ENCOUNTER — Ambulatory Visit: Admitting: General Practice

## 2023-08-19 ENCOUNTER — Ambulatory Visit: Attending: Cardiovascular Disease | Admitting: Cardiovascular Disease

## 2023-08-19 VITALS — BP 124/78 | HR 67 | Ht 73.0 in | Wt 249.0 lb

## 2023-08-19 DIAGNOSIS — E782 Mixed hyperlipidemia: Secondary | ICD-10-CM | POA: Diagnosis not present

## 2023-08-19 DIAGNOSIS — I10 Essential (primary) hypertension: Secondary | ICD-10-CM

## 2023-08-19 DIAGNOSIS — R931 Abnormal findings on diagnostic imaging of heart and coronary circulation: Secondary | ICD-10-CM | POA: Diagnosis not present

## 2023-08-19 MED ORDER — MINOXIDIL 10 MG PO TABS
10.0000 mg | ORAL_TABLET | Freq: Every day | ORAL | 3 refills | Status: AC
  Filled 2023-08-19 – 2024-02-05 (×11): qty 90, 90d supply, fill #0

## 2023-08-19 NOTE — Patient Instructions (Signed)
 Medication Instructions:  - INCREASE MINOXIDIL (LONITEN) to 10 MG by mouth daily .    *If you need a refill on your cardiac medications before your next appointment, please call your pharmacy*   Lab Work: None    If you have labs (blood work) drawn today and your tests are completely normal, you will receive your results only by: MyChart Message (if you have MyChart) OR A paper copy in the mail If you have any lab test that is abnormal or we need to change your treatment, we will call you to review the results.   Testing/Procedures: None    Follow-Up: At [facility], you and your health needs are our priority.  As part of our continuing mission to provide you with exceptional heart care, we have created designated Provider Care Teams.  These Care Teams include your primary Cardiologist (physician) and Advanced Practice Providers (APPs -  Physician Assistants and Nurse Practitioners) who all work together to provide you with the care you need, when you need it.  We recommend signing up for the patient portal called "MyChart".  Sign up information is provided on this After Visit Summary.  MyChart is used to connect with patients for Virtual Visits (Telemedicine).  Patients are able to view lab/test results, encounter notes, upcoming appointments, etc.  Non-urgent messages can be sent to your provider as well.   To learn more about what you can do with MyChart, go to [email or web address].    Your next appointment:   1 year(s)  The format for your next appointment:   In Person  Provider:   Edd Fabian, FNP, Micah Flesher, PA-C, Marjie Skiff, PA-C, Robet Leu, PA-C, Juanda Crumble, PA-C, Joni Reining, DNP, ANP, Azalee Course, PA-C, or Bernadene Person, NP       Other Instructions

## 2023-08-23 ENCOUNTER — Other Ambulatory Visit (HOSPITAL_COMMUNITY): Payer: Self-pay

## 2023-08-28 ENCOUNTER — Other Ambulatory Visit (HOSPITAL_COMMUNITY): Payer: Self-pay

## 2023-08-28 ENCOUNTER — Other Ambulatory Visit: Payer: Self-pay | Admitting: Cardiovascular Disease

## 2023-08-30 ENCOUNTER — Other Ambulatory Visit (HOSPITAL_COMMUNITY): Payer: Self-pay

## 2023-08-30 MED ORDER — CARVEDILOL 25 MG PO TABS
25.0000 mg | ORAL_TABLET | Freq: Two times a day (BID) | ORAL | 3 refills | Status: AC
  Filled 2023-08-30 – 2023-11-05 (×2): qty 180, 90d supply, fill #0
  Filled 2024-02-06: qty 180, 90d supply, fill #1
  Filled 2024-05-15: qty 180, 90d supply, fill #2

## 2023-08-30 MED ORDER — MINOXIDIL 10 MG PO TABS
10.0000 mg | ORAL_TABLET | Freq: Every day | ORAL | 1 refills | Status: AC
  Filled 2023-08-30: qty 90, 90d supply, fill #0
  Filled 2023-11-05 – 2023-11-13 (×2): qty 90, 90d supply, fill #1

## 2023-08-31 ENCOUNTER — Other Ambulatory Visit (HOSPITAL_COMMUNITY): Payer: Self-pay

## 2023-09-16 ENCOUNTER — Other Ambulatory Visit: Payer: Self-pay

## 2023-09-25 ENCOUNTER — Other Ambulatory Visit (HOSPITAL_COMMUNITY): Payer: Self-pay

## 2023-09-27 ENCOUNTER — Other Ambulatory Visit: Payer: Self-pay

## 2023-09-30 ENCOUNTER — Other Ambulatory Visit (HOSPITAL_COMMUNITY): Payer: Self-pay

## 2023-09-30 DIAGNOSIS — M542 Cervicalgia: Secondary | ICD-10-CM | POA: Diagnosis not present

## 2023-09-30 MED ORDER — CYCLOBENZAPRINE HCL 10 MG PO TABS
10.0000 mg | ORAL_TABLET | Freq: Three times a day (TID) | ORAL | 0 refills | Status: AC
  Filled 2023-09-30: qty 30, 10d supply, fill #0

## 2023-09-30 MED ORDER — PREDNISONE 10 MG (21) PO TBPK
ORAL_TABLET | ORAL | 0 refills | Status: AC
Start: 2023-09-30 — End: ?
  Filled 2023-09-30: qty 21, 6d supply, fill #0

## 2023-09-30 MED ORDER — OXYCODONE HCL 5 MG PO TABS
5.0000 mg | ORAL_TABLET | Freq: Every day | ORAL | 0 refills | Status: AC | PRN
  Filled 2023-09-30: qty 20, 20d supply, fill #0

## 2023-10-25 ENCOUNTER — Other Ambulatory Visit (HOSPITAL_COMMUNITY): Payer: Self-pay

## 2023-10-25 ENCOUNTER — Other Ambulatory Visit: Payer: Self-pay

## 2023-11-06 ENCOUNTER — Other Ambulatory Visit (HOSPITAL_COMMUNITY): Payer: Self-pay

## 2023-11-08 ENCOUNTER — Other Ambulatory Visit (HOSPITAL_COMMUNITY): Payer: Self-pay

## 2023-12-12 NOTE — Progress Notes (Unsigned)
  Cardiology Office Note:  .   Date:  12/16/2023  ID:  Ryan Gay, DOB [birth year], MRN [id number] PCP: Marvene Prentice SAUNDERS, FNP  [facility] Providers Cardiologist:  None { History of Present Illness: .    Chief Complaint  Patient presents with   Follow-up         Ryan Gay is a 33 y.o. male with history of HTN, HLD who presents for follow-up.   History of Present Illness   Ryan Gay is a 33 year old male with hypertension, obesity, and hyperlipidemia who presents for follow-up.  He monitors his blood pressure at home, with readings typically around 130 to 140 mmHg systolic, measured around noon. He takes minoxidil , losartan , and carvedilol , which help manage his blood pressure.  He engages in regular physical activity, walking two to three miles a day, and is mindful of his salt intake. He has been managing his weight and reports that his cholesterol levels were good in recent lab results. He is no longer using Ozempic  as it is not covered anymore.  No issues with sleep apnea, stating that it has been resolved. He does not consume alcohol or smoke. 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 He experiences some stress but is managing it through walking and other activities.           Problem List 1. HTN 2. Obesity 3. OSA -cured with weight loss  4.  Hyperlipidemia -T chol 142, HDL 34, LDL 87, TG 113 5. Calcium  score 1 (75th percentile)    ROS: All other ROS reviewed and negative. Pertinent positives noted in the HPI.     Studies Reviewed: SABRA       Physical Exam:   VS:  BP (!) 152/90   Pulse 70   Ht 6' 1 (1.854 m)   Wt 245 lb (111.1 kg)   BMI 32.32 kg/m    Wt Readings from Last 3 Encounters:  12/16/23 245 lb (111.1 kg)  08/19/23 249 lb (112.9 kg)  11/18/22 226 lb (102.5 kg)    GEN: Well nourished, well developed in no acute distress NECK: No JVD; No carotid bruits CARDIAC:  RRR, no murmurs, rubs, gallops RESPIRATORY:  Clear to auscultation without rales, wheezing or rhonchi  ABDOMEN: Soft, non-tender, non-distended EXTREMITIES:  No edema; No deformity  ASSESSMENT AND PLAN: .   Assessment and Plan    Essential hypertension Blood pressure elevated at 152/90 mmHg; home readings 130s-140s mmHg. Current regimen effective. - Continue minoxidil , losartan , and carvedilol .  Hyperlipidemia LDL cholesterol 87 mg/dL. Lipitor effective. - Continue Lipitor at current dose.  Obesity Engaging in regular physical activity; mindful of dietary habits. - Continue physical activity and dietary modifications.              Follow-up: Return in about 1 year (around 12/15/2024).  Signed, Darryle DASEN. Barbaraann, MD, [facility]  [facility]  [address] [place], [place] [address] [phone]  10:11 AM

## 2023-12-16 ENCOUNTER — Other Ambulatory Visit: Payer: Self-pay | Admitting: Cardiovascular Disease

## 2023-12-16 ENCOUNTER — Encounter: Payer: Self-pay | Admitting: Cardiovascular Disease

## 2023-12-16 ENCOUNTER — Other Ambulatory Visit (HOSPITAL_COMMUNITY): Payer: Self-pay

## 2023-12-16 ENCOUNTER — Ambulatory Visit: Attending: Cardiovascular Disease | Admitting: Cardiovascular Disease

## 2023-12-16 VITALS — BP 152/90 | HR 70 | Ht 73.0 in | Wt 245.0 lb

## 2023-12-16 DIAGNOSIS — E782 Mixed hyperlipidemia: Secondary | ICD-10-CM | POA: Diagnosis not present

## 2023-12-16 DIAGNOSIS — I10 Essential (primary) hypertension: Secondary | ICD-10-CM | POA: Diagnosis not present

## 2023-12-16 DIAGNOSIS — R931 Abnormal findings on diagnostic imaging of heart and coronary circulation: Secondary | ICD-10-CM | POA: Diagnosis not present

## 2023-12-16 MED ORDER — ATORVASTATIN CALCIUM 40 MG PO TABS
40.0000 mg | ORAL_TABLET | Freq: Every day | ORAL | 3 refills | Status: AC
  Filled 2023-12-16: qty 90, 90d supply, fill #0
  Filled 2024-03-21: qty 90, 90d supply, fill #1
  Filled 2024-06-16: qty 90, 90d supply, fill #2

## 2023-12-16 NOTE — Patient Instructions (Signed)
   Follow-Up: At [facility], you and your health needs are our priority.  As part of our continuing mission to provide you with exceptional heart care, our providers are all part of one team.  This team includes your primary Cardiologist (physician) and Advanced Practice Providers or APPs (Physician Assistants and Nurse Practitioners) who all work together to provide you with the care you need, when you need it.  Your next appointment:   12 month(s)  Provider:   One of our Advanced Practice Providers (APPs): Morse Clause, PA-C  Lamarr Satterfield, NP Miriam Shams, NP  Olivia Pavy, PA-C Josefa Beauvais, NP  Leontine Salen, PA-C Orren Fabry, PA-C  [place], [place] Jackee Alberts, NP  Damien Braver, NP Jon Hails, PA-C  Waddell Donath, PA-C    Dayna Dunn, PA-C  Glendia Ferrier, PA-C Lum Louis, NP Katlyn West, NP Callie Goodrich, PA-C  Evan Williams, PA-C Sheng Haley, PA-C  Xika Zhao, NP Kathleen Johnson, PA-C

## 2024-01-26 ENCOUNTER — Other Ambulatory Visit (HOSPITAL_COMMUNITY): Payer: Self-pay

## 2024-01-26 ENCOUNTER — Other Ambulatory Visit: Payer: Self-pay

## 2024-01-29 ENCOUNTER — Other Ambulatory Visit (HOSPITAL_COMMUNITY): Payer: Self-pay

## 2024-02-01 ENCOUNTER — Other Ambulatory Visit (HOSPITAL_COMMUNITY): Payer: Self-pay

## 2024-02-03 ENCOUNTER — Other Ambulatory Visit: Payer: Self-pay

## 2024-02-03 ENCOUNTER — Other Ambulatory Visit (HOSPITAL_COMMUNITY): Payer: Self-pay

## 2024-02-03 DIAGNOSIS — E7800 Pure hypercholesterolemia, unspecified: Secondary | ICD-10-CM | POA: Diagnosis not present

## 2024-02-03 DIAGNOSIS — R059 Cough, unspecified: Secondary | ICD-10-CM | POA: Diagnosis not present

## 2024-02-03 DIAGNOSIS — K219 Gastro-esophageal reflux disease without esophagitis: Secondary | ICD-10-CM | POA: Diagnosis not present

## 2024-02-03 DIAGNOSIS — R110 Nausea: Secondary | ICD-10-CM | POA: Diagnosis not present

## 2024-02-03 DIAGNOSIS — I10 Essential (primary) hypertension: Secondary | ICD-10-CM | POA: Diagnosis not present

## 2024-02-03 DIAGNOSIS — R7303 Prediabetes: Secondary | ICD-10-CM | POA: Diagnosis not present

## 2024-02-03 MED ORDER — ONDANSETRON HCL 4 MG PO TABS
4.0000 mg | ORAL_TABLET | Freq: Two times a day (BID) | ORAL | 0 refills | Status: AC
  Filled 2024-02-03: qty 40, 10d supply, fill #0

## 2024-02-03 MED ORDER — MINOXIDIL 10 MG PO TABS
10.0000 mg | ORAL_TABLET | Freq: Three times a day (TID) | ORAL | 3 refills | Status: AC
  Filled 2024-02-03 – 2024-04-14 (×4): qty 270, 90d supply, fill #0
  Filled 2024-04-17: qty 30, 10d supply, fill #0
  Filled 2024-05-03: qty 30, 10d supply, fill #1
  Filled 2024-05-15: qty 30, 10d supply, fill #2
  Filled 2024-06-16: qty 270, 90d supply, fill #3

## 2024-02-03 MED ORDER — MONTELUKAST SODIUM 10 MG PO TABS
10.0000 mg | ORAL_TABLET | Freq: Every day | ORAL | 0 refills | Status: DC
  Filled 2024-02-03: qty 90, 90d supply, fill #0

## 2024-02-04 ENCOUNTER — Other Ambulatory Visit (HOSPITAL_COMMUNITY): Payer: Self-pay

## 2024-02-05 ENCOUNTER — Other Ambulatory Visit (HOSPITAL_COMMUNITY): Payer: Self-pay

## 2024-02-07 ENCOUNTER — Other Ambulatory Visit: Payer: Self-pay

## 2024-02-07 ENCOUNTER — Other Ambulatory Visit (HOSPITAL_COMMUNITY): Payer: Self-pay

## 2024-03-21 ENCOUNTER — Other Ambulatory Visit (HOSPITAL_COMMUNITY): Payer: Self-pay

## 2024-03-22 ENCOUNTER — Other Ambulatory Visit: Payer: Self-pay

## 2024-04-14 ENCOUNTER — Other Ambulatory Visit (HOSPITAL_COMMUNITY): Payer: Self-pay

## 2024-04-17 ENCOUNTER — Other Ambulatory Visit (HOSPITAL_COMMUNITY): Payer: Self-pay

## 2024-04-18 ENCOUNTER — Other Ambulatory Visit (HOSPITAL_COMMUNITY): Payer: Self-pay

## 2024-04-19 ENCOUNTER — Other Ambulatory Visit (HOSPITAL_COMMUNITY): Payer: Self-pay

## 2024-04-24 DIAGNOSIS — D2261 Melanocytic nevi of right upper limb, including shoulder: Secondary | ICD-10-CM | POA: Diagnosis not present

## 2024-04-24 DIAGNOSIS — L814 Other melanin hyperpigmentation: Secondary | ICD-10-CM | POA: Diagnosis not present

## 2024-04-24 DIAGNOSIS — L821 Other seborrheic keratosis: Secondary | ICD-10-CM | POA: Diagnosis not present

## 2024-04-24 DIAGNOSIS — D225 Melanocytic nevi of trunk: Secondary | ICD-10-CM | POA: Diagnosis not present

## 2024-04-27 ENCOUNTER — Other Ambulatory Visit (HOSPITAL_COMMUNITY): Payer: Self-pay

## 2024-04-27 MED ORDER — MONTELUKAST SODIUM 10 MG PO TABS
10.0000 mg | ORAL_TABLET | Freq: Every day | ORAL | 0 refills | Status: AC
  Filled 2024-04-27: qty 90, 90d supply, fill #0

## 2024-05-03 ENCOUNTER — Other Ambulatory Visit: Payer: Self-pay

## 2024-05-03 ENCOUNTER — Other Ambulatory Visit (HOSPITAL_COMMUNITY): Payer: Self-pay

## 2024-06-16 ENCOUNTER — Other Ambulatory Visit: Payer: Self-pay

## 2024-06-16 ENCOUNTER — Other Ambulatory Visit (HOSPITAL_COMMUNITY): Payer: Self-pay
